# Patient Record
Sex: Male | Born: 1960
Health system: Southern US, Community
[De-identification: ages and names within clinical notes are randomized; demographics above are authoritative.]

## PROBLEM LIST (undated history)

## (undated) DIAGNOSIS — M1990 Unspecified osteoarthritis, unspecified site: Secondary | ICD-10-CM

## (undated) DIAGNOSIS — H269 Unspecified cataract: Secondary | ICD-10-CM

## (undated) HISTORY — DX: Unspecified osteoarthritis, unspecified site: M19.90

## (undated) HISTORY — DX: Unspecified cataract: H26.9

---

## 2020-01-30 ENCOUNTER — Encounter (HOSPITAL_COMMUNITY): Payer: Self-pay | Admitting: *Deleted

## 2020-01-30 ENCOUNTER — Emergency Department (HOSPITAL_COMMUNITY): Payer: Self-pay

## 2020-01-30 ENCOUNTER — Other Ambulatory Visit: Payer: Self-pay

## 2020-01-30 ENCOUNTER — Emergency Department (HOSPITAL_COMMUNITY)
Admission: EM | Admit: 2020-01-30 | Discharge: 2020-01-31 | Disposition: A | Discharge status: Home / Self Care | Payer: Self-pay | Attending: Emergency Medicine | Admitting: Emergency Medicine

## 2020-01-30 DIAGNOSIS — I10 Essential (primary) hypertension: Secondary | ICD-10-CM | POA: Insufficient documentation

## 2020-01-30 DIAGNOSIS — F17210 Nicotine dependence, cigarettes, uncomplicated: Secondary | ICD-10-CM | POA: Insufficient documentation

## 2020-01-30 DIAGNOSIS — F1099 Alcohol use, unspecified with unspecified alcohol-induced disorder: Secondary | ICD-10-CM | POA: Insufficient documentation

## 2020-01-30 DIAGNOSIS — F1090 Alcohol use, unspecified, uncomplicated: Secondary | ICD-10-CM

## 2020-01-30 DIAGNOSIS — Z789 Other specified health status: Secondary | ICD-10-CM

## 2020-01-30 LAB — BASIC METABOLIC PANEL
Anion gap: 18 — ABNORMAL HIGH (ref 5–15)
BUN: 6 mg/dL — ABNORMAL LOW (ref 8–23)
CO2: 21 mmol/L — ABNORMAL LOW (ref 22–32)
Calcium: 9.5 mg/dL (ref 8.9–10.3)
Chloride: 99 mmol/L (ref 98–111)
Creatinine, Ser: 0.83 mg/dL (ref 0.61–1.24)
GFR calc Af Amer: >60 mL/min (ref >60)
GFR calc non Af Amer: >60 mL/min (ref >60)
Glucose, Bld: 87 mg/dL (ref 70–99)
Potassium: 3.8 mmol/L (ref 3.5–5.1)
Sodium: 138 mmol/L (ref 135–145)

## 2020-01-30 LAB — CBC
HCT: 41.4 % (ref 39.0–52.0)
Hemoglobin: 14.3 g/dL (ref 13.0–17.0)
MCH: 31.6 pg (ref 26.0–34.0)
MCHC: 34.5 g/dL (ref 30.0–36.0)
MCV: 91.4 fL (ref 80.0–100.0)
Platelets: 181 10*3/uL (ref 150–400)
RBC: 4.53 MIL/uL (ref 4.22–5.81)
RDW: 14 % (ref 11.5–15.5)
WBC: 3.5 10*3/uL — ABNORMAL LOW (ref 4.0–10.5)
nRBC: 0 % (ref 0.0–0.2)

## 2020-01-30 LAB — TROPONIN I (HIGH SENSITIVITY)
Troponin I (High Sensitivity): 24 ng/L — ABNORMAL HIGH (ref <18)
Troponin I (High Sensitivity): 32 ng/L — ABNORMAL HIGH (ref <18)

## 2020-01-30 LAB — LACTIC ACID, PLASMA: Lactic Acid, Venous: 3.4 mmol/L (ref 0.5–1.9)

## 2020-01-30 MED ORDER — AMLODIPINE BESYLATE 5 MG PO TABS
5.0000 mg | ORAL_TABLET | Freq: Once | ORAL | Status: AC
  Administered 2020-01-30: 5 mg via ORAL
  Filled 2020-01-30: qty 1

## 2020-01-30 MED ORDER — LACTATED RINGERS IV BOLUS
1000.0000 mL | Freq: Once | INTRAVENOUS | Status: AC
  Administered 2020-01-30: 1000 mL via INTRAVENOUS

## 2020-01-30 MED ORDER — SODIUM CHLORIDE 0.9% FLUSH: 3.0000 mL | Freq: Once | INTRAVENOUS | Status: DC

## 2020-01-30 MED ORDER — AMLODIPINE BESYLATE 5 MG PO TABS
10.0000 mg | ORAL_TABLET | Freq: Once | ORAL | Status: AC
  Administered 2020-01-30: 10 mg via ORAL
  Filled 2020-01-30: qty 2

## 2020-01-30 NOTE — ED Triage Notes (Signed)
The pt saw a doctor yesterday and was toild that his bp was high  He was told to come to the ed yestereday  But he did not   Today his bp is high

## 2020-01-30 NOTE — ED Provider Notes (Signed)
[facility] EMERGENCY DEPARTMENT Provider Note   CSN: [id number] Arrival date & time: 01/30/20  1855     History Chief Complaint  Patient presents with  . Hypertension    Christian Matthews is a 59 y.o. male.  Patient is a 59 year old male with no known medical history but has not seen a doctor for years and for social security met with the doctor yesterday and was told his blood pressure was elevated.  Patient states that when he checked his blood pressure today at home it was elevated at 230/190.  This scared him and he decided to come to the emergency room even though yesterday he went home.  Patient denies any new vision changes, headache, chest pain, shortness of breath.  No unilateral numbness or weakness.  Patient reports that he does have a family history of people with high blood pressure but he has never checked himself until yesterday.  He does smoke cigarettes daily but denies any cocaine or marijuana use.  He also drinks alcohol daily but reports he can drink it or not drink it and does not get symptoms of withdrawal.  Usually has at least 1-2 beers per day.  He currently takes no medications.  Diet has been unchanged.  No weight loss.  The history is provided by the patient.  Hypertension Chronicity: unknown. The current episode started more than 1 week ago. The problem occurs constantly. The problem has not changed since onset.Pertinent negatives include no chest pain, no abdominal pain, no headaches and no shortness of breath. Associated symptoms comments: No focal weakness. Nothing aggravates the symptoms. Nothing relieves the symptoms. He has tried nothing for the symptoms.       History reviewed. No pertinent past medical history.  There are no problems to display for this patient.   History reviewed. No pertinent surgical history.     No family history on file.  Social History   Tobacco Use  . Smoking status: Current Every Day Smoker  . Smokeless  tobacco: Never Used  Substance Use Topics  . Alcohol use: Yes  . Drug use: Not on file    Home Medications Prior to Admission medications   Not on File    Allergies    Patient has no allergy information on record.  Review of Systems   Review of Systems  Respiratory: Negative for shortness of breath.   Cardiovascular: Negative for chest pain.  Gastrointestinal: Negative for abdominal pain.  Neurological: Negative for headaches.  All other systems reviewed and are negative.   Physical Exam Updated Vital Signs BP (!) 163/115 (BP Location: Right Arm)   Pulse (!) 123   Temp 99.1 F (37.3 C) (Oral)   Resp 16   Ht 5' 5"  (1.651 m)   Wt 56.2 kg   SpO2 100%   BMI 20.63 kg/m   Physical Exam Vitals and nursing note reviewed.  Constitutional:      General: He is not in acute distress.    Appearance: Normal appearance. He is well-developed and normal weight.  HENT:     Head: Normocephalic and atraumatic.  Eyes:     Conjunctiva/sclera: Conjunctivae normal.     Pupils: Pupils are equal, round, and reactive to light.  Cardiovascular:     Rate and Rhythm: Normal rate and regular rhythm.     Heart sounds: No murmur.  Pulmonary:     Effort: Pulmonary effort is normal. No respiratory distress.     Breath sounds: Normal breath sounds. No  wheezing or rales.  Abdominal:     General: There is no distension.     Palpations: Abdomen is soft.     Tenderness: There is no abdominal tenderness. There is no guarding or rebound.  Musculoskeletal:        General: No tenderness. Normal range of motion.     Cervical back: Normal range of motion and neck supple.     Right lower leg: No edema.     Left lower leg: No edema.  Skin:    General: Skin is warm and dry.     Capillary Refill: Capillary refill takes less than 2 seconds.     Findings: No erythema or rash.  Neurological:     General: No focal deficit present.     Mental Status: He is alert and oriented to person, place, and time.  Mental status is at baseline.  Psychiatric:        Mood and Affect: Mood normal.        Behavior: Behavior normal.        Thought Content: Thought content normal.     ED Results / Procedures / Treatments   Labs (all labs ordered are listed, but only abnormal results are displayed) Labs Reviewed  BASIC METABOLIC PANEL - Abnormal; Notable for the following components:      Result Value   CO2 21 (*)    BUN 6 (*)    Anion gap 18 (*)    All other components within normal limits  CBC - Abnormal; Notable for the following components:   WBC 3.5 (*)    All other components within normal limits  LACTIC ACID, PLASMA - Abnormal; Notable for the following components:   Lactic Acid, Venous 3.4 (*)    All other components within normal limits  TROPONIN I (HIGH SENSITIVITY) - Abnormal; Notable for the following components:   Troponin I (High Sensitivity) 24 (*)    All other components within normal limits  TROPONIN I (HIGH SENSITIVITY)    EKG EKG Interpretation  Date/Time:  Saturday January 30 2020 19:30:35 EST Ventricular Rate:  108 PR Interval:  198 QRS Duration: 72 QT Interval:  342 QTC Calculation: 458 R Axis:   37 Text Interpretation: Sinus tachycardia Biatrial enlargement Possible Anterior infarct , age undetermined Abnormal ECG No previous ECGs available Confirmed by Gerlene Fee [phone]) on 01/30/2020 7:40:12 PM   Radiology DG Chest 2 View  Result Date: 01/30/2020 CLINICAL DATA:  59 year old male with hypertension. EXAM: CHEST - 2 VIEW COMPARISON:  None. FINDINGS: The lungs are clear. There is no pleural effusion or pneumothorax. The cardiac silhouette is within normal limits. Mild atherosclerotic calcification of the aortic arch. No acute osseous pathology. IMPRESSION: No acute cardiopulmonary process. Electronically Signed   By: Anner Crete M.D.   On: 01/30/2020 19:50    Procedures Procedures (including critical care time)  Medications Ordered in ED Medications    sodium chloride flush (NS) 0.9 % injection 3 mL (has no administration in time range)  amLODipine (NORVASC) tablet 5 mg (has no administration in time range)  amLODipine (NORVASC) tablet 10 mg (has no administration in time range)    ED Course  I have reviewed the triage vital signs and the nursing notes.  Pertinent labs & imaging results that were available during my care of the patient were reviewed by me and considered in my medical decision making (see chart for details).    MDM Rules/Calculators/A&P  59 year old male presenting today with hypertension.  This is most likely a long standing issue as he has not checked his blood pressure in years or seen a doctor in years until yesterday when he was told his blood pressure was elevated.  He denies any symptoms concerning for hypertensive emergency.  BMP today does show an anion gap of unknown significance.  Patient reports that he does have 1-2 beers per day but possibly he is underestimating and could have a lactic acidosis causing the anion gap.  He does not use salicylates, low suspicion for toxic alcohol ingestion.  He has having no chest pain but does have a troponin of 24 which may be related to the high blood pressure.  EKG today does show some signs concerning for LVH.  He is mildly tachycardic in the low 100s but low suspicion for PE at this time.  Patient has no strokelike symptoms and is otherwise well-appearing.  However repeated blood pressures are elevated at 240/110.  Lactic acid is pending.  Patient given 10 mg of amlodipine.  Will cycle troponins and continue to follow.  11:22 PM Lactate is elevated at 3.4 which is most likely the cause of his anion gap.  Patient was given IV fluids.  Still waiting on delta troponin.  After amlodipine patient's blood pressure is now 187/96.  The intention was that patient would get 10 mg of amlodipine but there was a glitch in the ordering system and he received 15  mg.  Final Clinical Impression(s) / ED Diagnoses Final diagnoses:  None    Rx / DC Orders ED Discharge Orders    None       Blanchie Dessert, MD 02/01/20 2130

## 2020-01-30 NOTE — ED Notes (Signed)
Dr. Anitra Lauth notified of lactic acid of 3.4

## 2020-01-31 LAB — TROPONIN I (HIGH SENSITIVITY): Troponin I (High Sensitivity): 33 ng/L — ABNORMAL HIGH (ref <18)

## 2020-01-31 MED ORDER — ADULT MULTIVITAMIN W/MINERALS CH: 1.0000 | ORAL_TABLET | Freq: Every day | ORAL | 0 refills | Status: DC

## 2020-01-31 MED ORDER — AMLODIPINE BESYLATE 10 MG PO TABS: 10.0000 mg | ORAL_TABLET | Freq: Every day | ORAL | 0 refills | Status: DC

## 2020-01-31 MED ORDER — THIAMINE HCL 100 MG PO TABS: 100.0000 mg | ORAL_TABLET | Freq: Every day | ORAL | 0 refills | Status: DC

## 2020-01-31 NOTE — ED Notes (Signed)
RN is drawing 3rd Troponin now

## 2020-01-31 NOTE — ED Notes (Signed)
Pt ambulatory to BR with strong, steady gait

## 2020-01-31 NOTE — ED Notes (Signed)
Troponin drawn, labeled with 2 pt identifiers, and sent to lab 

## 2020-01-31 NOTE — ED Provider Notes (Signed)
11:45 PM  Assumed care from Dr. Anitra Lauth.  Patient is a 59 year old male with history of heavy alcohol use, undiagnosed hypertension who presents today with asymptomatic hypertension.  Received 15 mg of amlodipine and blood pressure is slowly improving.  Labs show metabolic acidosis with anion gap likely from heavy alcohol use with elevated lactate.  No infectious symptoms.  Getting IV fluids.  Troponin initially was 24 and then has gone up to 32.  Plan is to repeat third troponin at 12:30 AM.  Likely discharge home on 10 mg of amlodipine.  Social work consult has been placed to help patient establish care with a PCP.  3:30 AM  Pt's third troponin is flat.  He still has no complaints.  Blood pressure currently 140/70s.  No sign of alcohol withdrawal.  I provided list of primary care physicians for follow-up.  Will discharge with amlodipine, thiamine and multivitamin.  Have encouraged him to decrease his alcohol intake.  Discussed return precautions.  He is comfortable with this plan.   At this time, I do not feel there is any life-threatening condition present. I have reviewed, interpreted and discussed all results (EKG, imaging, lab, urine as appropriate) and exam findings with patient/family. I have reviewed nursing notes and appropriate previous records.  I feel the patient is safe to be discharged home without further emergent workup and can continue workup as an outpatient as needed. Discussed usual and customary return precautions. Patient/family verbalize understanding and are comfortable with this plan.  Outpatient follow-up has been provided as needed. All questions have been answered.    Karson Reede, Layla Maw, DO 01/31/20 [phone]

## 2020-01-31 NOTE — Discharge Instructions (Signed)
Steps to find a Primary Care Provider (PCP): ° °Call [phone] or [phone] to access "[facility] Find a Doctor Service." ° °2.  You may also go on the [facility] website at [email or web address] ° °3.  [facility] also frequently accepts new patients. ° °[facility]  °[address] °[place] [address] °[phone] ° °4.  There are also multiple [facility], [facility] practices throughout the [place] that are frequently accepting new patients. You may find a clinic that is close to your home and contact them. ° °[facility] °[email or web address] °[phone] ° °[facility] °[email or web address] ° °[facility] °[email or web address] °[phone] ° °[facility] °[email or web address] °[phone] ° °5.  Local Health Departments also can provide primary care services. ° °[facility]  °[address] °[place] [address] [phone] ° °[facility] °[address] °[place] [address] [phone] ° °[facility] °[address]  °[place] [address] °[phone] ° ° °

## 2020-02-01 NOTE — Progress Notes (Signed)
02/01/2020 6:54 pm TOC CM spoke to pt and gave permission to speak to sister. Provided sister with pt's information on appt at [facility] on 02/11/2020 at 8:50 am. States he will pick up his medication on tomorrow. Educated the importance of taking medication as prescribed. Referring ED provider updated. Isidoro Donning RN CCM, [facility] ED TOC CM [phone]

## 2020-02-10 NOTE — Progress Notes (Signed)
Patient ID: Christian Matthews, male   DOB: 02/14/1961, 59 y.o.   MRN: [id number]   Christian Matthews, is a 59 y.o. male  INO:[id number]  [id number]:[id number]  DOB - January 24, 1961  Subjective:  Chief Complaint and HPI: Christian Matthews is a 59 y.o. male here today to establish care and for a follow up visit After ED visit 01/30/2020 for elevated BP.  He was started on amlodipine 10mg .  He is compliant on this.  He did not know that he had htn before now.  Has never been on meds.  Has cut back smoking to 3 cigs/day.  Drinks 2-40 ounce beers daily.  Does not exercise.  No HA/CP.  His wife is here with him today.  Denies any cocaine or illicit drug use.    From A/P: 59 year old male presenting today with hypertension.  This is most likely a long standing issue as he has not checked his blood pressure in years or seen a doctor in years until yesterday when he was told his blood pressure was elevated.  He denies any symptoms concerning for hypertensive emergency.  BMP today does show an anion gap of unknown significance.  Patient reports that he does have 1-2 beers per day but possibly he is underestimating and could have a lactic acidosis causing the anion gap.  He does not use salicylates, low suspicion for toxic alcohol ingestion.  He has having no chest pain but does have a troponin of 24 which may be related to the high blood pressure.  EKG today does show some signs concerning for LVH.  He is mildly tachycardic in the low 100s but low suspicion for PE at this time.  Patient has no strokelike symptoms and is otherwise well-appearing.  However repeated blood pressures are elevated at 240/110.  Lactic acid is pending.  Patient given 10 mg of amlodipine.  Will cycle troponins and continue to follow.  11:22 PM Lactate is elevated at 3.4 which is most likely the cause of his anion gap.  Patient was given IV fluids.  Still waiting on delta troponin.  After amlodipine patient's blood pressure is now 187/96.  The intention was that  patient would get 10 mg of amlodipine but there was a glitch in the ordering system and he received 15 mg.  ED/Hospital notes reviewed/summarized above.   Social History:  Married, drinks alcohol, smokes Family history:  All of family with heart disease, htn  ROS:   Constitutional:  No f/c, No night sweats, No unexplained weight loss. EENT:  No vision changes, No blurry vision, No hearing changes. No mouth, throat, or ear problems.  Respiratory: No cough, No SOB Cardiac: No CP, no palpitations GI:  No abd pain, No N/V/D. GU: No Urinary s/sx Musculoskeletal: No joint pain Neuro: No headache, no dizziness, no motor weakness.  Skin: No rash Endocrine:  No polydipsia. No polyuria.  Psych: Denies SI/HI  No problems updated.  ALLERGIES: No Known Allergies  PAST MEDICAL HISTORY: History reviewed. No pertinent past medical history.  MEDICATIONS AT HOME: Prior to Admission medications   Medication Sig Start Date End Date Taking? Authorizing Provider  amLODipine (NORVASC) 10 MG tablet Take 1 tablet (10 mg total) by mouth daily. 02/11/20  Yes 02/13/20, PA-C  Multiple Vitamin (MULTIVITAMIN WITH MINERALS) TABS tablet Take 1 tablet by mouth daily. 01/31/20  Yes Ward, 02/02/20, DO  thiamine 100 MG tablet Take 1 tablet (100 mg total) by mouth daily. 01/31/20  Yes Ward, 02/02/20, DO  hydrochlorothiazide (  HYDRODIURIL) 25 MG tablet Take 1 tablet (25 mg total) by mouth daily. 02/11/20   Argentina Donovan, PA-C     Objective:  EXAM:   Vitals:   02/11/20 0859  BP: (!) 204/106  Pulse: (!) 103  Temp: 97.7 F (36.5 C)  TempSrc: Temporal  SpO2: 99%  Weight: 123 lb (55.8 kg)  Height: 5\' 5"  (1.651 m)    General appearance : A&OX3. NAD. Non-toxic-appearing HEENT: Atraumatic and Normocephalic.  PERRLA. EOM intact.   Neck: supple, no JVD. No cervical lymphadenopathy. No thyromegaly Chest/Lungs:  Breathing-non-labored, Good air entry bilaterally, breath sounds normal without rales,  rhonchi, or wheezing  CVS: S1 S2 regular, no murmurs, gallops, rubs  Extremities: Bilateral Lower Ext shows no edema, both legs are warm to touch with = pulse throughout Neurology:  CN II-XII grossly intact, Non focal.   Psych:  TP linear. J/I WNL. Normal speech. Appropriate eye contact and affect.  Skin:  No Rash  Data Review No results found for: HGBA1C   Assessment & Plan   1. Hypertensive urgency- -Check blood pressure daily and record and bring to next visit - cloNIDine (CATAPRES) tablet 0.2 mg.  BP responded down to 181/94. - Lipid panel - Comprehensive metabolic panel  2. Smoker He is down to 3 cigs per day. Smoking and dangers of nicotine have been discussed at length. Long term health consequences of smoking reviewed in detail.  Methods for helping with cessation have been reviewed.  Patient expresses understanding.   3. Alcohol use Cessation advised FactoringRate.ca is the website for alcoholics anonymous.  It can be a good source of education and support for stopping alcohol intake.  There are many online and in-person meetings available in [place] metabolic panel  4. Screening cholesterol level - Lipid panel  5. Encounter for examination following treatment at hospital  6. Hypertension, unspecified type Not controlled-add HCTZ - hydrochlorothiazide (HYDRODIURIL) 25 MG tablet; Take 1 tablet (25 mg total) by mouth daily.  Dispense: 30 tablet; Refill: 3 - amLODipine (NORVASC) 10 MG tablet; Take 1 tablet (10 mg total) by mouth daily.  Dispense: 30 tablet; Refill: 3 - Lipid panel - Comprehensive metabolic panel Drink 80 ounces water daily(high lactic acid in ED) -Check blood pressure daily and record and bring to next visit  Counseled extensively with the patient and his wife about all of the above  Patient have been counseled extensively about nutrition and exercise  Return in about 3 weeks (around 03/03/2020) for 3 weeks with Lurena Joiner for BP management and  education and 6 weeks be assigned a PCP here.  The patient was given clear instructions to go to ER or return to medical center if symptoms don't improve, worsen or new problems develop. The patient verbalized understanding. The patient was told to call to get lab results if they haven't heard anything in the next week.     Freeman Caldron, PA-C [facility], [place]   02/11/2020, 9:23 AM

## 2020-02-11 ENCOUNTER — Other Ambulatory Visit: Payer: Self-pay

## 2020-02-11 ENCOUNTER — Ambulatory Visit: Payer: Self-pay | Attending: Family Medicine | Admitting: Physician Assistant

## 2020-02-11 VITALS — BP 181/94 | HR 103 | Temp 97.7°F | Ht 65.0 in | Wt 123.0 lb

## 2020-02-11 DIAGNOSIS — I160 Hypertensive urgency: Secondary | ICD-10-CM

## 2020-02-11 DIAGNOSIS — Z09 Encounter for follow-up examination after completed treatment for conditions other than malignant neoplasm: Secondary | ICD-10-CM

## 2020-02-11 DIAGNOSIS — Z13220 Encounter for screening for lipoid disorders: Secondary | ICD-10-CM

## 2020-02-11 DIAGNOSIS — Z7289 Other problems related to lifestyle: Secondary | ICD-10-CM

## 2020-02-11 DIAGNOSIS — F17200 Nicotine dependence, unspecified, uncomplicated: Secondary | ICD-10-CM

## 2020-02-11 DIAGNOSIS — Z789 Other specified health status: Secondary | ICD-10-CM

## 2020-02-11 DIAGNOSIS — I10 Essential (primary) hypertension: Secondary | ICD-10-CM

## 2020-02-11 MED ORDER — CLONIDINE HCL 0.2 MG PO TABS
0.2000 mg | ORAL_TABLET | Freq: Once | ORAL | Status: AC
  Administered 2020-02-11: 09:00:00 0.2 mg via ORAL

## 2020-02-11 MED ORDER — HYDROCHLOROTHIAZIDE 25 MG PO TABS: 25.0000 mg | ORAL_TABLET | Freq: Every day | ORAL | 3 refills | Status: DC

## 2020-02-11 MED ORDER — AMLODIPINE BESYLATE 10 MG PO TABS: 10.0000 mg | ORAL_TABLET | Freq: Every day | ORAL | 3 refills | Status: DC

## 2020-02-11 MED FILL — HYDROCHLOROTHIAZIDE 25 MG T: 25 | 30 days supply | Qty: 30 | Fill #0

## 2020-02-11 MED FILL — AMLODIPINE BESYLATE 10 MG T: 10 | 30 days supply | Qty: 30 | Fill #0

## 2020-02-11 NOTE — Patient Instructions (Addendum)
Drink 80 ounces water daily Check blood pressure daily and record and bring to next visit  NoInsuranceAgent.es is the website for alcoholics anonymous.  It can be a good source of education and support for stopping alcohol intake.  There are many online and in-person meetings available in [place]  Hypertension, Adult Hypertension is another name for high blood pressure. High blood pressure forces your heart to work harder to pump blood. This can cause problems over time. There are two numbers in a blood pressure reading. There is a top number (systolic) over a bottom number (diastolic). It is best to have a blood pressure that is below 120/80. Healthy choices can help lower your blood pressure, or you may need medicine to help lower it. What are the causes? The cause of this condition is not known. Some conditions may be related to high blood pressure. What increases the risk?  Smoking.  Having type 2 diabetes mellitus, high cholesterol, or both.  Not getting enough exercise or physical activity.  Being overweight.  Having too much fat, sugar, calories, or salt (sodium) in your diet.  Drinking too much alcohol.  Having long-term (chronic) kidney disease.  Having a family history of high blood pressure.  Age. Risk increases with age.  Race. You may be at higher risk if you are African American.  Gender. Men are at higher risk than women before age 90. After age 21, women are at higher risk than men.  Having obstructive sleep apnea.  Stress. What are the signs or symptoms?  High blood pressure may not cause symptoms. Very high blood pressure (hypertensive crisis) may cause: ? Headache. ? Feelings of worry or nervousness (anxiety). ? Shortness of breath. ? Nosebleed. ? A feeling of being sick to your stomach (nausea). ? Throwing up (vomiting). ? Changes in how you see. ? Very bad chest pain. ? Seizures. How is this treated?  This condition is treated by making healthy  lifestyle changes, such as: ? Eating healthy foods. ? Exercising more. ? Drinking less alcohol.  Your health care provider may prescribe medicine if lifestyle changes are not enough to get your blood pressure under control, and if: ? Your top number is above 130. ? Your bottom number is above 80.  Your personal target blood pressure may vary. Follow these instructions at home: Eating and drinking   If told, follow the DASH eating plan. To follow this plan: ? Fill one half of your plate at each meal with fruits and vegetables. ? Fill one fourth of your plate at each meal with whole grains. Whole grains include whole-wheat pasta, brown rice, and whole-grain bread. ? Eat or drink low-fat dairy products, such as skim milk or low-fat yogurt. ? Fill one fourth of your plate at each meal with low-fat (lean) proteins. Low-fat proteins include fish, chicken without skin, eggs, beans, and tofu. ? Avoid fatty meat, cured and processed meat, or chicken with skin. ? Avoid pre-made or processed food.  Eat less than 1,500 mg of salt each day.  Do not drink alcohol if: ? Your doctor tells you not to drink. ? You are pregnant, may be pregnant, or are planning to become pregnant.  If you drink alcohol: ? Limit how much you use to:  0-1 drink a day for women.  0-2 drinks a day for men. ? Be aware of how much alcohol is in your drink. In the [place] one drink equals one 12 oz bottle of beer (355 mL), one 5 oz  glass of wine (148 mL), or one 1 oz glass of hard liquor (44 mL). Lifestyle   Work with your doctor to stay at a healthy weight or to lose weight. Ask your doctor what the best weight is for you.  Get at least 30 minutes of exercise most days of the week. This may include walking, swimming, or biking.  Get at least 30 minutes of exercise that strengthens your muscles (resistance exercise) at least 3 days a week. This may include lifting weights or doing Pilates.  Do not use any products  that contain nicotine or tobacco, such as cigarettes, e-cigarettes, and chewing tobacco. If you need help quitting, ask your doctor.  Check your blood pressure at home as told by your doctor.  Keep all follow-up visits as told by your doctor. This is important. Medicines  Take over-the-counter and prescription medicines only as told by your doctor. Follow directions carefully.  Do not skip doses of blood pressure medicine. The medicine does not work as well if you skip doses. Skipping doses also puts you at risk for problems.  Ask your doctor about side effects or reactions to medicines that you should watch for. Contact a doctor if you:  Think you are having a reaction to the medicine you are taking.  Have headaches that keep coming back (recurring).  Feel dizzy.  Have swelling in your ankles.  Have trouble with your vision. Get help right away if you:  Get a very bad headache.  Start to feel mixed up (confused).  Feel weak or numb.  Feel faint.  Have very bad pain in your: ? Chest. ? Belly (abdomen).  Throw up more than once.  Have trouble breathing. Summary  Hypertension is another name for high blood pressure.  High blood pressure forces your heart to work harder to pump blood.  For most people, a normal blood pressure is less than 120/80.  Making healthy choices can help lower blood pressure. If your blood pressure does not get lower with healthy choices, you may need to take medicine. This information is not intended to replace advice given to you by your health care provider. Make sure you discuss any questions you have with your health care provider. Document Revised: 07/16/2018 Document Reviewed: 07/16/2018 Elsevier Patient Education  2020 [employer].

## 2020-02-12 LAB — COMPREHENSIVE METABOLIC PANEL
ALT: 49 IU/L — ABNORMAL HIGH (ref 0–44)
AST: 67 IU/L — ABNORMAL HIGH (ref 0–40)
Albumin/Globulin Ratio: 1.3 (ref 1.2–2.2)
Albumin: 4.9 g/dL — ABNORMAL HIGH (ref 3.8–4.8)
Alkaline Phosphatase: 110 IU/L (ref 39–117)
BUN/Creatinine Ratio: 12 (ref 10–24)
BUN: 11 mg/dL (ref 8–27)
Bilirubin Total: 0.4 mg/dL (ref 0.0–1.2)
CO2: 23 mmol/L (ref 20–29)
Calcium: 10.3 mg/dL — ABNORMAL HIGH (ref 8.6–10.2)
Chloride: 97 mmol/L (ref 96–106)
Creatinine, Ser: 0.91 mg/dL (ref 0.76–1.27)
GFR calc Af Amer: 105 mL/min/{1.73_m2} (ref >59)
GFR calc non Af Amer: 91 mL/min/{1.73_m2} (ref >59)
Globulin, Total: 3.8 g/dL (ref 1.5–4.5)
Glucose: 107 mg/dL — ABNORMAL HIGH (ref 65–99)
Potassium: 4.2 mmol/L (ref 3.5–5.2)
Sodium: 138 mmol/L (ref 134–144)
Total Protein: 8.7 g/dL — ABNORMAL HIGH (ref 6.0–8.5)

## 2020-02-12 LAB — LIPID PANEL
Chol/HDL Ratio: 1.9 ratio (ref 0.0–5.0)
Cholesterol, Total: 182 mg/dL (ref 100–199)
HDL: 98 mg/dL (ref >39)
LDL Chol Calc (NIH): 71 mg/dL (ref 0–99)
Triglycerides: 71 mg/dL (ref 0–149)
VLDL Cholesterol Cal: 13 mg/dL (ref 5–40)

## 2020-03-03 ENCOUNTER — Ambulatory Visit: Payer: Self-pay | Attending: Family Medicine | Admitting: Pharmacist

## 2020-03-03 ENCOUNTER — Encounter: Payer: Self-pay | Admitting: Pharmacist

## 2020-03-03 ENCOUNTER — Other Ambulatory Visit: Payer: Self-pay

## 2020-03-03 VITALS — BP 171/90 | HR 101

## 2020-03-03 DIAGNOSIS — I10 Essential (primary) hypertension: Secondary | ICD-10-CM

## 2020-03-03 NOTE — Progress Notes (Signed)
   S:    PCP: Not assigned   Patient arrives in good spirits. Presents to the clinic for BP check. Patient was referred on 02/11/2020 by Georgian Co.   Patient reports adherence with medications but did not take amlodipine yesterday and did not take any medication this morning. He usually takes HCTZ in the morning and amlodipine in the evening.   Current BP Medications include:  Amlodipine 10 mg daily, HCTZ 25 mg daily  Dietary habits include:  Reports compliance with salt restriction and denies drinking excess caffeine  Exercise habits include: none  Family / Social history:  - FHx: no pertinent positives listed in Epic  - Tobacco: smokes "every now and then" - has not smoked today - Alcohol: drank 1 beer last night   O:  Vitals:   03/03/20 1127  BP: (!) 171/90  Pulse: (!) 101    Home BP readings: reports that he takes at home but does not recall the readings  Last 3 Office BP readings: BP Readings from Last 3 Encounters:  03/03/20 (!) 171/90  02/11/20 (!) 181/94  01/31/20 (!) 148/79   BMET    Component Value Date/Time   NA 138 02/11/2020 0956   K 4.2 02/11/2020 0956   CL 97 02/11/2020 0956   CO2 23 02/11/2020 0956   GLUCOSE 107 (H) 02/11/2020 0956   GLUCOSE 87 01/30/2020 1934   BUN 11 02/11/2020 0956   CREATININE 0.91 02/11/2020 0956   CALCIUM 10.3 (H) 02/11/2020 0956   GFRNONAA 91 02/11/2020 0956   GFRAA 105 02/11/2020 0956    Renal function: CrCl cannot be calculated (Patient's most recent lab result is older than the maximum 21 days allowed.).  Clinical ASCVD: No  The 10-year ASCVD risk score Denman George DC Jr., et al., 2013) is: 30.1%   Values used to calculate the score:     Age: 59 years     Sex: Male     Is Non-Hispanic African American: Yes     Diabetic: No     Tobacco smoker: Yes     Systolic Blood Pressure: 171 mmHg     Is BP treated: Yes     HDL Cholesterol: 98 mg/dL     Total Cholesterol: 182 mg/dL   A/P: Hypertension longstanding currently  uncontrolled on current medications. BP Goal = <130/80 mmHg. Patient reports adherence with medications but he has not taken any medication in the past ~24 hours. I instructed him to take his HCTZ tab while here today.  Will have him take both medications in the morning starting tomorrow in efforts to improve adherence. I have instructed him to take both of these before seeing me in one week for BP recheck.  -Continued HCTZ, amlodipine at current doses.  -Counseled on lifestyle modifications for blood pressure control including reduced dietary sodium, increased exercise, adequate sleep  Results reviewed and written information provided.   Total time in face-to-face counseling 15 minutes.   F/U Clinic Visit in 1 week.   Butch Penny, PharmD, CPP Clinical Pharmacist [facility] & [facility] [phone]

## 2020-03-11 ENCOUNTER — Ambulatory Visit: Payer: Self-pay | Attending: Family Medicine | Admitting: Pharmacist

## 2020-03-11 ENCOUNTER — Other Ambulatory Visit: Payer: Self-pay

## 2020-03-11 VITALS — BP 186/93 | HR 98

## 2020-03-11 DIAGNOSIS — I10 Essential (primary) hypertension: Secondary | ICD-10-CM

## 2020-03-11 MED ORDER — LOSARTAN POTASSIUM 50 MG PO TABS: 50.0000 mg | ORAL_TABLET | Freq: Every day | ORAL | 0 refills | Status: DC

## 2020-03-11 MED FILL — LOSARTAN POTASSIUM 50 MG TA: 50 | 30 days supply | Qty: 30 | Fill #0

## 2020-03-11 NOTE — Progress Notes (Signed)
   S:    PCP: Not assigned   Patient arrives in good spirits. Presents to the clinic for BP check. Patient was referred on 02/11/2020 by Georgian Co. I saw him on 03/03/2020 - BP was still elevated but he had not taken his medications that day.    Today, pt denies chest pain, dyspnea, HA or blurred vision. No dizziness or LE edema.   Patient reports adherence with medications and took both this morning.   Current BP Medications include:  Amlodipine 10 mg daily, HCTZ 25 mg daily  Dietary habits include:  Reports compliance with salt restriction and denies drinking excess caffeine  Exercise habits include: none  Family / Social history:  - FHx: no pertinent positives listed in Epic  - Tobacco: smokes "every now and then" - has not smoked today - Alcohol: drank 1 beer last night   O:  Vitals:   03/11/20 1341  BP: (!) 186/93  Pulse: 98    Home BP readings: reports that he takes at home but does not recall the readings  Last 3 Office BP readings: BP Readings from Last 3 Encounters:  03/11/20 (!) 186/93  03/03/20 (!) 171/90  02/11/20 (!) 181/94   BMET    Component Value Date/Time   NA 138 02/11/2020 0956   K 4.2 02/11/2020 0956   CL 97 02/11/2020 0956   CO2 23 02/11/2020 0956   GLUCOSE 107 (H) 02/11/2020 0956   GLUCOSE 87 01/30/2020 1934   BUN 11 02/11/2020 0956   CREATININE 0.91 02/11/2020 0956   CALCIUM 10.3 (H) 02/11/2020 0956   GFRNONAA 91 02/11/2020 0956   GFRAA 105 02/11/2020 0956    Renal function: CrCl cannot be calculated (Patient's most recent lab result is older than the maximum 21 days allowed.).  Clinical ASCVD: No  The 10-year ASCVD risk score Denman George DC Jr., et al., 2013) is: 34.3%   Values used to calculate the score:     Age: 59 years     Sex: Male     Is Non-Hispanic African American: Yes     Diabetic: No     Tobacco smoker: Yes     Systolic Blood Pressure: 186 mmHg     Is BP treated: Yes     HDL Cholesterol: 98 mg/dL     Total Cholesterol:  182 mg/dL   A/P: Hypertension longstanding currently very elevated on current medications. BP Goal = <130/80 mmHg. Patient reports adherence with medications. Denies symptoms suggestive of target organ damage. Will add losartan 50 mg daily to his regimen.  -Start losartan 50 mg daily.  -Continued HCTZ, amlodipine at current doses.  -Counseled on lifestyle modifications for blood pressure control including reduced dietary sodium, increased exercise, adequate sleep  Results reviewed and written information provided.   Total time in face-to-face counseling 15 minutes.   F/U Clinic Visit in 1 week.   Butch Penny, PharmD, CPP Clinical Pharmacist [facility] & [facility] [phone]

## 2020-03-29 ENCOUNTER — Other Ambulatory Visit: Payer: Self-pay

## 2020-03-29 ENCOUNTER — Ambulatory Visit: Payer: Self-pay | Attending: Family Medicine | Admitting: Family Medicine

## 2020-03-29 ENCOUNTER — Encounter: Payer: Self-pay | Admitting: Family Medicine

## 2020-03-29 VITALS — BP 182/79 | HR 94 | Ht 65.0 in | Wt 117.0 lb

## 2020-03-29 DIAGNOSIS — I10 Essential (primary) hypertension: Secondary | ICD-10-CM

## 2020-03-29 DIAGNOSIS — Z720 Tobacco use: Secondary | ICD-10-CM

## 2020-03-29 MED ORDER — LOSARTAN POTASSIUM 100 MG PO TABS: 100.0000 mg | ORAL_TABLET | Freq: Every day | ORAL | 1 refills | Status: DC

## 2020-03-29 NOTE — Progress Notes (Signed)
Subjective:  Patient ID: Christian Matthews, male    DOB: 1961-11-07  Age: 59 y.o. MRN: [id number]  CC: Hypertension   HPI Lochlin Matthews is a 59 year old male with a history of Hypertension here to establish care. His blood pressure is elevated and he endorses compliance with his medications. He has no dyspnea or chest pains. He has no additional concerns today. He smokes 2 cig/day.  Past Medical History:  Diagnosis Date  . Essential hypertension 03/30/2020    No past surgical history on file.  No family history on file.  No Known Allergies  Outpatient Medications Prior to Visit  Medication Sig Dispense Refill  . amLODipine (NORVASC) 10 MG tablet Take 1 tablet (10 mg total) by mouth daily. 30 tablet 3  . hydrochlorothiazide (HYDRODIURIL) 25 MG tablet Take 1 tablet (25 mg total) by mouth daily. 30 tablet 3  . Multiple Vitamin (MULTIVITAMIN WITH MINERALS) TABS tablet Take 1 tablet by mouth daily. 30 tablet 0  . thiamine 100 MG tablet Take 1 tablet (100 mg total) by mouth daily. 30 tablet 0  . losartan (COZAAR) 50 MG tablet Take 1 tablet (50 mg total) by mouth daily. 90 tablet 0   No facility-administered medications prior to visit.     ROS Review of Systems  Constitutional: Negative for activity change and appetite change.  HENT: Negative for sinus pressure and sore throat.   Eyes: Negative for visual disturbance.  Respiratory: Negative for cough, chest tightness and shortness of breath.   Cardiovascular: Negative for chest pain and leg swelling.  Gastrointestinal: Negative for abdominal distention, abdominal pain, constipation and diarrhea.  Endocrine: Negative.   Genitourinary: Negative for dysuria.  Musculoskeletal: Negative for joint swelling and myalgias.  Skin: Negative for rash.  Allergic/Immunologic: Negative.   Neurological: Negative for weakness, light-headedness and numbness.  Psychiatric/Behavioral: Negative for dysphoric mood and suicidal ideas.    Objective:   BP (!) 182/79   Pulse 94   Ht 5\' 5"  (1.651 m)   Wt 117 lb (53.1 kg)   SpO2 100%   BMI 19.47 kg/m   BP/Weight 03/29/2020 03/11/2020 0/86/5784  Systolic BP 696 295 284  Diastolic BP 79 93 90  Wt. (Lbs) 117 - -  BMI 19.47 - -      Physical Exam Constitutional:      Appearance: He is well-developed.  Neck:     Vascular: No JVD.  Cardiovascular:     Rate and Rhythm: Normal rate.     Heart sounds: Normal heart sounds. No murmur.  Pulmonary:     Effort: Pulmonary effort is normal.     Breath sounds: Normal breath sounds. No wheezing or rales.  Chest:     Chest wall: No tenderness.  Abdominal:     General: Bowel sounds are normal. There is no distension.     Palpations: Abdomen is soft. There is no mass.     Tenderness: There is no abdominal tenderness.  Musculoskeletal:        General: Normal range of motion.     Right lower leg: No edema.     Left lower leg: No edema.  Neurological:     Mental Status: He is alert and oriented to person, place, and time.  Psychiatric:        Mood and Affect: Mood normal.     CMP Latest Ref Rng & Units 02/11/2020 01/30/2020  Glucose 65 - 99 mg/dL 107(H) 87  BUN 8 - 27 mg/dL 11 6(L)  Creatinine  0.76 - 1.27 mg/dL 0.48 8.89  Sodium 169 - 144 mmol/L 138 138  Potassium 3.5 - 5.2 mmol/L 4.2 3.8  Chloride 96 - 106 mmol/L 97 99  CO2 20 - 29 mmol/L 23 21(L)  Calcium 8.6 - 10.2 mg/dL 10.3(H) 9.5  Total Protein 6.0 - 8.5 g/dL 4.5(W) -  Total Bilirubin 0.0 - 1.2 mg/dL 0.4 -  Alkaline Phos 39 - 117 IU/L 110 -  AST 0 - 40 IU/L 67(H) -  ALT 0 - 44 IU/L 49(H) -    Lipid Panel     Component Value Date/Time   CHOL 182 02/11/2020 0956   TRIG 71 02/11/2020 0956   HDL 98 02/11/2020 0956   CHOLHDL 1.9 02/11/2020 0956   LDLCALC 71 02/11/2020 0956    CBC    Component Value Date/Time   WBC 3.5 (L) 01/30/2020 1934   RBC 4.53 01/30/2020 1934   HGB 14.3 01/30/2020 1934   HCT 41.4 01/30/2020 1934   PLT 181 01/30/2020 1934   MCV 91.4 01/30/2020  1934   MCH 31.6 01/30/2020 1934   MCHC 34.5 01/30/2020 1934   RDW 14.0 01/30/2020 1934    No results found for: HGBA1C  Assessment & Plan:  1. Essential hypertension Uncontrolled Increased Losartan dose Continue Amlodipine and HCTZ Counseled on blood pressure goal of less than 130/80, low-sodium, DASH diet, medication compliance, 150 minutes of moderate intensity exercise per week. Discussed medication compliance, adverse effects. - losartan (COZAAR) 100 MG tablet; Take 1 tablet (100 mg total) by mouth daily.  Dispense: 90 tablet; Refill: 1   2. Tobacco abuse Spent 3 minutes counselling on smoking cessation and he is not ready to quit.  Meds ordered this encounter  Medications  . losartan (COZAAR) 100 MG tablet    Sig: Take 1 tablet (100 mg total) by mouth daily.    Dispense:  90 tablet    Refill:  1    Dose increase    Follow-up: Return in about 1 month (around 04/29/2020) for follow up on Hypertension.       Hoy Register, MD, FAAFP. [facility] and [facility] [place], [place] [phone]   03/29/2020, 11:31 AM

## 2020-03-29 NOTE — Patient Instructions (Signed)

## 2020-03-30 ENCOUNTER — Encounter: Payer: Self-pay | Admitting: Family Medicine

## 2020-03-30 DIAGNOSIS — I10 Essential (primary) hypertension: Secondary | ICD-10-CM

## 2020-03-30 DIAGNOSIS — F17210 Nicotine dependence, cigarettes, uncomplicated: Secondary | ICD-10-CM | POA: Insufficient documentation

## 2020-03-30 HISTORY — DX: Essential (primary) hypertension: I10

## 2020-04-13 MED FILL — HYDROCHLOROTHIAZIDE 25 MG T: 25 | 30 days supply | Qty: 30 | Fill #1

## 2020-04-13 MED FILL — AMLODIPINE BESYLATE 10 MG T: 10 | 30 days supply | Qty: 30 | Fill #1

## 2020-05-03 ENCOUNTER — Ambulatory Visit: Payer: Self-pay | Attending: Family Medicine | Admitting: Family Medicine

## 2020-05-03 ENCOUNTER — Other Ambulatory Visit: Payer: Self-pay

## 2020-05-03 ENCOUNTER — Encounter: Payer: Self-pay | Admitting: Family Medicine

## 2020-05-03 VITALS — BP 175/83 | HR 99 | Ht 65.0 in | Wt 117.4 lb

## 2020-05-03 DIAGNOSIS — I10 Essential (primary) hypertension: Secondary | ICD-10-CM

## 2020-05-03 DIAGNOSIS — F17200 Nicotine dependence, unspecified, uncomplicated: Secondary | ICD-10-CM

## 2020-05-03 MED ORDER — HYDROCHLOROTHIAZIDE 25 MG PO TABS: 25.0000 mg | ORAL_TABLET | Freq: Every day | ORAL | 3 refills | Status: DC

## 2020-05-03 MED ORDER — CARVEDILOL 6.25 MG PO TABS: 6.2500 mg | ORAL_TABLET | Freq: Two times a day (BID) | ORAL | 3 refills | Status: DC

## 2020-05-03 MED ORDER — LOSARTAN POTASSIUM 100 MG PO TABS: 100.0000 mg | ORAL_TABLET | Freq: Every day | ORAL | 3 refills | Status: DC

## 2020-05-03 MED ORDER — AMLODIPINE BESYLATE 10 MG PO TABS: 10.0000 mg | ORAL_TABLET | Freq: Every day | ORAL | 3 refills | Status: DC

## 2020-05-03 MED FILL — LOSARTAN POTASSIUM 100 MG T: 100 | 30 days supply | Qty: 30 | Fill #0

## 2020-05-03 MED FILL — CARVEDILOL 6.25 MG TABLET: 6.25 | 30 days supply | Qty: 60 | Fill #0

## 2020-05-03 NOTE — Progress Notes (Signed)
Subjective:  Patient ID: Christian Matthews, male    DOB: 08-23-1961  Age: 59 y.o. MRN: [id number]  CC: Hypertension   HPI Christian Matthews  is a 59 year old male with a history of Hypertension here for follow-up visit.  He endorses compliance with amlodipine and losartan but his blood pressure is still elevated today.  He smokes 2 cig/day and is not ready to quit at this time Denies dyspnea, chest pains or other symptoms.  Past Medical History:  Diagnosis Date  . Essential hypertension 03/30/2020    No past surgical history on file.  No family history on file.  No Known Allergies  Outpatient Medications Prior to Visit  Medication Sig Dispense Refill  . amLODipine (NORVASC) 10 MG tablet Take 1 tablet (10 mg total) by mouth daily. 30 tablet 3  . hydrochlorothiazide (HYDRODIURIL) 25 MG tablet Take 1 tablet (25 mg total) by mouth daily. 30 tablet 3  . losartan (COZAAR) 100 MG tablet Take 1 tablet (100 mg total) by mouth daily. 90 tablet 1  . Multiple Vitamin (MULTIVITAMIN WITH MINERALS) TABS tablet Take 1 tablet by mouth daily. 30 tablet 0  . thiamine 100 MG tablet Take 1 tablet (100 mg total) by mouth daily. 30 tablet 0   No facility-administered medications prior to visit.     ROS Review of Systems  Constitutional: Negative for activity change and appetite change.  HENT: Negative for sinus pressure and sore throat.   Eyes: Negative for visual disturbance.  Respiratory: Negative for cough, chest tightness and shortness of breath.   Cardiovascular: Negative for chest pain and leg swelling.  Gastrointestinal: Negative for abdominal distention, abdominal pain, constipation and diarrhea.  Endocrine: Negative.   Genitourinary: Negative for dysuria.  Musculoskeletal: Negative for joint swelling and myalgias.  Skin: Negative for rash.  Allergic/Immunologic: Negative.   Neurological: Negative for weakness, light-headedness and numbness.  Psychiatric/Behavioral: Negative for dysphoric mood  and suicidal ideas.    Objective:  BP (!) 175/83   Pulse 99   Ht 5\' 5"  (1.651 m)   Wt 117 lb 6.4 oz (53.3 kg)   SpO2 100%   BMI 19.54 kg/m   BP/Weight 05/03/2020 03/29/2020 03/11/2020  Systolic BP 175 182 186  Diastolic BP 83 79 93  Wt. (Lbs) 117.4 117 -  BMI 19.54 19.47 -      Physical Exam Constitutional:      Appearance: He is well-developed.  Neck:     Vascular: No JVD.  Cardiovascular:     Rate and Rhythm: Normal rate.     Heart sounds: Normal heart sounds. No murmur heard.   Pulmonary:     Effort: Pulmonary effort is normal.     Breath sounds: Normal breath sounds. No wheezing or rales.  Chest:     Chest wall: No tenderness.  Abdominal:     General: Bowel sounds are normal. There is no distension.     Palpations: Abdomen is soft. There is no mass.     Tenderness: There is no abdominal tenderness.  Musculoskeletal:        General: Normal range of motion.     Right lower leg: No edema.     Left lower leg: No edema.  Neurological:     Mental Status: He is alert and oriented to person, place, and time.  Psychiatric:        Mood and Affect: Mood normal.     CMP Latest Ref Rng & Units 02/11/2020 01/30/2020  Glucose 65 - 99  mg/dL 107(H) 87  BUN 8 - 27 mg/dL 11 6(L)  Creatinine 0.76 - 1.27 mg/dL 0.91 0.83  Sodium 134 - 144 mmol/L 138 138  Potassium 3.5 - 5.2 mmol/L 4.2 3.8  Chloride 96 - 106 mmol/L 97 99  CO2 20 - 29 mmol/L 23 21(L)  Calcium 8.6 - 10.2 mg/dL 10.3(H) 9.5  Total Protein 6.0 - 8.5 g/dL 8.7(H) -  Total Bilirubin 0.0 - 1.2 mg/dL 0.4 -  Alkaline Phos 39 - 117 IU/L 110 -  AST 0 - 40 IU/L 67(H) -  ALT 0 - 44 IU/L 49(H) -    Lipid Panel     Component Value Date/Time   CHOL 182 02/11/2020 0956   TRIG 71 02/11/2020 0956   HDL 98 02/11/2020 0956   CHOLHDL 1.9 02/11/2020 0956   LDLCALC 71 02/11/2020 0956    CBC    Component Value Date/Time   WBC 3.5 (L) 01/30/2020 1934   RBC 4.53 01/30/2020 1934   HGB 14.3 01/30/2020 1934   HCT 41.4  01/30/2020 1934   PLT 181 01/30/2020 1934   MCV 91.4 01/30/2020 1934   MCH 31.6 01/30/2020 1934   MCHC 34.5 01/30/2020 1934   RDW 14.0 01/30/2020 1934      Assessment & Plan:  1. Essential hypertension Uncontrolled Carvedilol added to regimen Counseled on blood pressure goal of less than 130/80, low-sodium, DASH diet, medication compliance, 150 minutes of moderate intensity exercise per week. Discussed medication compliance, adverse effects. - carvedilol (COREG) 6.25 MG tablet; Take 1 tablet (6.25 mg total) by mouth 2 (two) times daily with a meal.  Dispense: 60 tablet; Refill: 3 - amLODipine (NORVASC) 10 MG tablet; Take 1 tablet (10 mg total) by mouth daily.  Dispense: 30 tablet; Refill: 3 - hydrochlorothiazide (HYDRODIURIL) 25 MG tablet; Take 1 tablet (25 mg total) by mouth daily.  Dispense: 30 tablet; Refill: 3 - losartan (COZAAR) 100 MG tablet; Take 1 tablet (100 mg total) by mouth daily.  Dispense: 30 tablet; Refill: 3 - Basic Metabolic Panel  2. Smoker Spent 3 minutes counseling on cessation and he is not ready to quit at this time  Return in about 6 weeks (around 06/14/2020) for Hypertension.      Charlott Rakes, MD, FAAFP. [facility] and [facility], [place]   05/03/2020, 2:50 PM

## 2020-05-03 NOTE — Patient Instructions (Signed)

## 2020-05-04 ENCOUNTER — Telehealth: Payer: Self-pay

## 2020-05-04 LAB — BASIC METABOLIC PANEL
BUN/Creatinine Ratio: 14 (ref 10–24)
BUN: 12 mg/dL (ref 8–27)
CO2: 26 mmol/L (ref 20–29)
Calcium: 10.3 mg/dL — ABNORMAL HIGH (ref 8.6–10.2)
Chloride: 94 mmol/L — ABNORMAL LOW (ref 96–106)
Creatinine, Ser: 0.85 mg/dL (ref 0.76–1.27)
GFR calc Af Amer: 109 mL/min/{1.73_m2} (ref >59)
GFR calc non Af Amer: 94 mL/min/{1.73_m2} (ref >59)
Glucose: 101 mg/dL — ABNORMAL HIGH (ref 65–99)
Potassium: 3.7 mmol/L (ref 3.5–5.2)
Sodium: 136 mmol/L (ref 134–144)

## 2020-05-04 NOTE — Telephone Encounter (Signed)
Patient was called and a voicemail was left informing patient to return phone call for lab results.  A letter with normal results have been mailed out to patient.

## 2020-05-04 NOTE — Telephone Encounter (Signed)
-----   Message from Hoy Register, MD sent at 05/04/2020 12:26 PM EDT ----- Please inform the patient that labs are normal. Thank you.

## 2020-05-11 MED FILL — AMLODIPINE BESYLATE 10 MG T: 10 | 30 days supply | Qty: 30 | Fill #2

## 2020-05-11 MED FILL — HYDROCHLOROTHIAZIDE 25 MG T: 25 | 30 days supply | Qty: 30 | Fill #2

## 2020-05-17 MED FILL — CARVEDILOL 6.25 MG TABLET: 6.25 | 30 days supply | Qty: 60 | Fill #0

## 2020-05-17 MED FILL — LOSARTAN POTASSIUM 100 MG T: 100 | 30 days supply | Qty: 30 | Fill #0

## 2020-06-14 ENCOUNTER — Ambulatory Visit: Payer: Self-pay | Admitting: Family Medicine

## 2020-07-13 MED FILL — HYDROCHLOROTHIAZIDE 25 MG T: 25 | 30 days supply | Qty: 30 | Fill #3

## 2020-07-13 MED FILL — LOSARTAN POTASSIUM 100 MG T: 100 | 30 days supply | Qty: 30 | Fill #1

## 2020-08-01 ENCOUNTER — Ambulatory Visit: Payer: Self-pay | Admitting: Family Medicine

## 2020-08-08 ENCOUNTER — Other Ambulatory Visit: Payer: Self-pay

## 2020-08-08 ENCOUNTER — Ambulatory Visit: Payer: Self-pay | Attending: Family Medicine | Admitting: Family Medicine

## 2020-08-08 ENCOUNTER — Encounter: Payer: Self-pay | Admitting: Family Medicine

## 2020-08-08 ENCOUNTER — Other Ambulatory Visit: Payer: Self-pay | Admitting: Family Medicine

## 2020-08-08 VITALS — BP 118/77 | HR 87 | Ht 65.0 in | Wt 117.4 lb

## 2020-08-08 DIAGNOSIS — I10 Essential (primary) hypertension: Secondary | ICD-10-CM

## 2020-08-08 DIAGNOSIS — Z720 Tobacco use: Secondary | ICD-10-CM

## 2020-08-08 MED ORDER — LOSARTAN POTASSIUM-HCTZ 100-25 MG PO TABS: 1.0000 | ORAL_TABLET | Freq: Every day | ORAL | 6 refills | Status: DC

## 2020-08-08 MED ORDER — AMLODIPINE BESYLATE 10 MG PO TABS: 10.0000 mg | ORAL_TABLET | Freq: Every day | ORAL | 6 refills | Status: DC

## 2020-08-08 MED ORDER — CARVEDILOL 6.25 MG PO TABS: 6.2500 mg | ORAL_TABLET | Freq: Two times a day (BID) | ORAL | 6 refills | Status: DC

## 2020-08-08 NOTE — Patient Instructions (Signed)
Discontinue Losartan, discontinue Hydrochlorothiazide. Start taking Losartan/Hydrochlorothiazide

## 2020-08-08 NOTE — Progress Notes (Signed)
Subjective:  Patient ID: Christian Matthews, male    DOB: 1961/05/04  Age: 59 y.o. MRN: [id number]  CC: Hypertension   HPI Christian Matthews is a 59 year old male with a history of hypertension here for a follow-up visit.  At his last office visit his blood pressure was significantly elevated at 175/83 but his blood pressure today is 118/77. He is doing well on his antihypertensive. He continues to smoke cigarettes about 3 cigarettes a day and is not ready to quit at this time. Denies presence of chest pain, pedal edema, dyspnea.  Past Medical History:  Diagnosis Date  . Essential hypertension 03/30/2020    No past surgical history on file.  No family history on file.  No Known Allergies  Outpatient Medications Prior to Visit  Medication Sig Dispense Refill  . Multiple Vitamin (MULTIVITAMIN WITH MINERALS) TABS tablet Take 1 tablet by mouth daily. 30 tablet 0  . thiamine 100 MG tablet Take 1 tablet (100 mg total) by mouth daily. 30 tablet 0  . amLODipine (NORVASC) 10 MG tablet Take 1 tablet (10 mg total) by mouth daily. 30 tablet 3  . carvedilol (COREG) 6.25 MG tablet Take 1 tablet (6.25 mg total) by mouth 2 (two) times daily with a meal. 60 tablet 3  . hydrochlorothiazide (HYDRODIURIL) 25 MG tablet Take 1 tablet (25 mg total) by mouth daily. 30 tablet 3  . losartan (COZAAR) 100 MG tablet Take 1 tablet (100 mg total) by mouth daily. 30 tablet 3   No facility-administered medications prior to visit.     ROS Review of Systems  Constitutional: Negative for activity change and appetite change.  HENT: Negative for sinus pressure and sore throat.   Eyes: Negative for visual disturbance.  Respiratory: Negative for cough, chest tightness and shortness of breath.   Cardiovascular: Negative for chest pain and leg swelling.  Gastrointestinal: Negative for abdominal distention, abdominal pain, constipation and diarrhea.  Endocrine: Negative.   Genitourinary: Negative for dysuria.    Musculoskeletal: Negative for joint swelling and myalgias.  Skin: Negative for rash.  Allergic/Immunologic: Negative.   Neurological: Negative for weakness, light-headedness and numbness.  Psychiatric/Behavioral: Negative for dysphoric mood and suicidal ideas.    Objective:  BP 118/77   Pulse 87   Ht 5\' 5"  (1.651 m)   Wt 117 lb 6.4 oz (53.3 kg)   SpO2 99%   BMI 19.54 kg/m   BP/Weight 08/08/2020 05/03/2020 03/29/2020  Systolic BP 118 175 182  Diastolic BP 77 83 79  Wt. (Lbs) 117.4 117.4 117  BMI 19.54 19.54 19.47      Physical Exam Constitutional:      Appearance: He is well-developed.  Neck:     Vascular: No JVD.  Cardiovascular:     Rate and Rhythm: Normal rate.     Heart sounds: Normal heart sounds. No murmur heard.   Pulmonary:     Effort: Pulmonary effort is normal.     Breath sounds: Normal breath sounds. No wheezing or rales.  Chest:     Chest wall: No tenderness.  Abdominal:     General: Bowel sounds are normal. There is no distension.     Palpations: Abdomen is soft. There is no mass.     Tenderness: There is no abdominal tenderness.  Musculoskeletal:        General: Normal range of motion.     Right lower leg: No edema.     Left lower leg: No edema.  Neurological:     Mental Status:  He is alert and oriented to person, place, and time.  Psychiatric:        Mood and Affect: Mood normal.     CMP Latest Ref Rng & Units 05/03/2020 02/11/2020 01/30/2020  Glucose 65 - 99 mg/dL 254(Y) 706(C) 87  BUN 8 - 27 mg/dL 12 11 6(L)  Creatinine 0.76 - 1.27 mg/dL 3.76 2.83 1.51  Sodium 134 - 144 mmol/L 136 138 138  Potassium 3.5 - 5.2 mmol/L 3.7 4.2 3.8  Chloride 96 - 106 mmol/L 94(L) 97 99  CO2 20 - 29 mmol/L 26 23 21(L)  Calcium 8.6 - 10.2 mg/dL 10.3(H) 10.3(H) 9.5  Total Protein 6.0 - 8.5 g/dL - 8.7(H) -  Total Bilirubin 0.0 - 1.2 mg/dL - 0.4 -  Alkaline Phos 39 - 117 IU/L - 110 -  AST 0 - 40 IU/L - 67(H) -  ALT 0 - 44 IU/L - 49(H) -    Lipid Panel      Component Value Date/Time   CHOL 182 02/11/2020 0956   TRIG 71 02/11/2020 0956   HDL 98 02/11/2020 0956   CHOLHDL 1.9 02/11/2020 0956   LDLCALC 71 02/11/2020 0956    CBC    Component Value Date/Time   WBC 3.5 (L) 01/30/2020 1934   RBC 4.53 01/30/2020 1934   HGB 14.3 01/30/2020 1934   HCT 41.4 01/30/2020 1934   PLT 181 01/30/2020 1934   MCV 91.4 01/30/2020 1934   MCH 31.6 01/30/2020 1934   MCHC 34.5 01/30/2020 1934   RDW 14.0 01/30/2020 1934    No results found for: HGBA1C  Assessment & Plan:  1. Essential hypertension Controlled Counseled on blood pressure goal of less than 130/80, low-sodium, DASH diet, medication compliance, 150 minutes of moderate intensity exercise per week. Discussed medication compliance, adverse effects. - losartan-hydrochlorothiazide (HYZAAR) 100-25 MG tablet; Take 1 tablet by mouth daily.  Dispense: 30 tablet; Refill: 6 - amLODipine (NORVASC) 10 MG tablet; Take 1 tablet (10 mg total) by mouth daily.  Dispense: 30 tablet; Refill: 6 - carvedilol (COREG) 6.25 MG tablet; Take 1 tablet (6.25 mg total) by mouth 2 (two) times daily with a meal.  Dispense: 60 tablet; Refill: 6  2. Tobacco abuse Spent 3 minutes counseling on smoking cessation and he is not ready to quit at this time     Meds ordered this encounter  Medications  . losartan-hydrochlorothiazide (HYZAAR) 100-25 MG tablet    Sig: Take 1 tablet by mouth daily.    Dispense:  30 tablet    Refill:  6    Discontinue Losartan, discontinue HCTZ  . amLODipine (NORVASC) 10 MG tablet    Sig: Take 1 tablet (10 mg total) by mouth daily.    Dispense:  30 tablet    Refill:  6  . carvedilol (COREG) 6.25 MG tablet    Sig: Take 1 tablet (6.25 mg total) by mouth 2 (two) times daily with a meal.    Dispense:  60 tablet    Refill:  6    Follow-up: Return in about 6 months (around 02/05/2021) for Chronic medical conditions.       Hoy Register, MD, FAAFP. [facility] and  Wellness [place], [place] [phone]   08/08/2020, 10:40 AM

## 2020-12-29 IMAGING — DX DG CHEST 2V
2 series · 2 of 2 positions shown · non-contrast
Comparison: None.

CLINICAL DATA: 59-year-old male with hypertension.

EXAM:
CHEST - 2 VIEW

[chest pa]
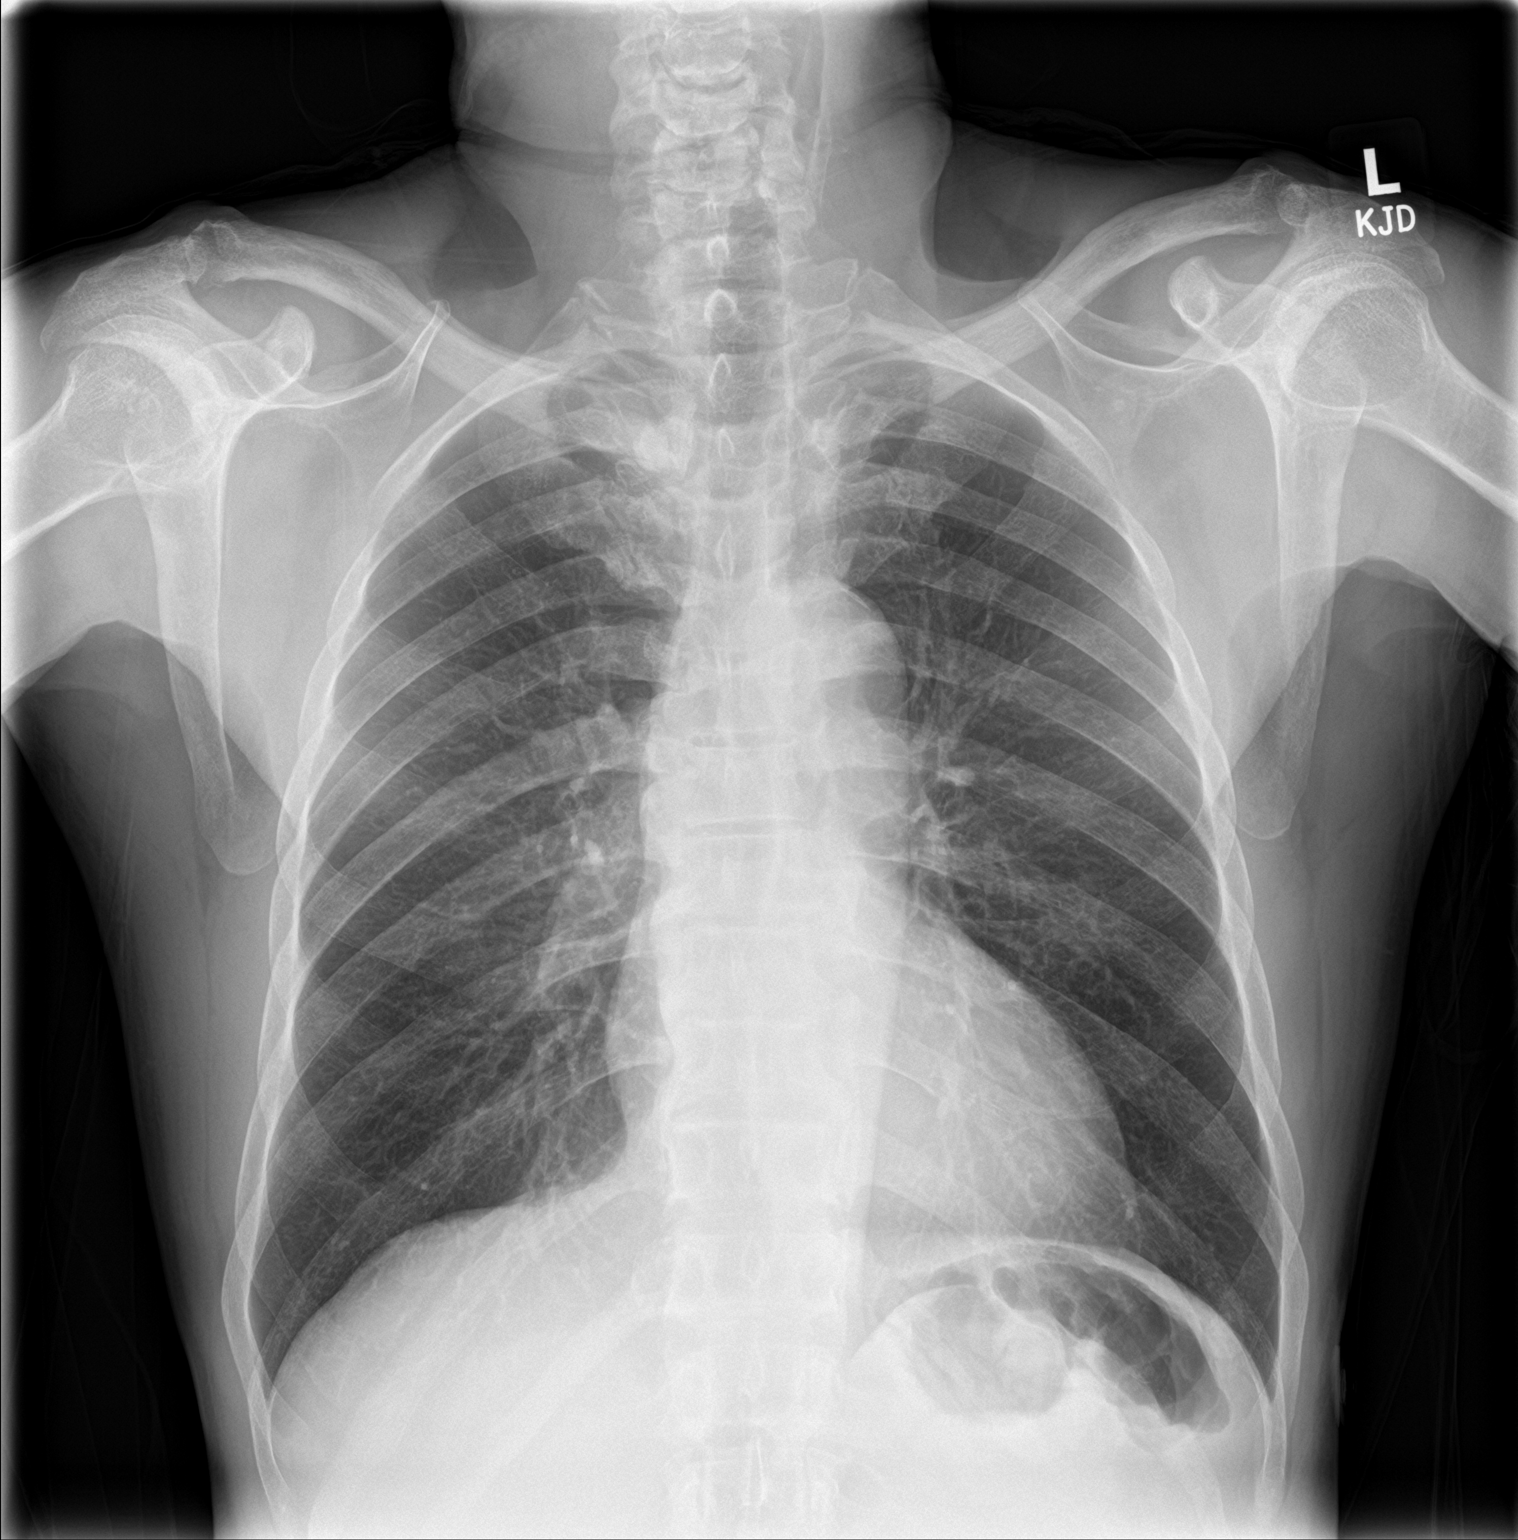

[chest lat]
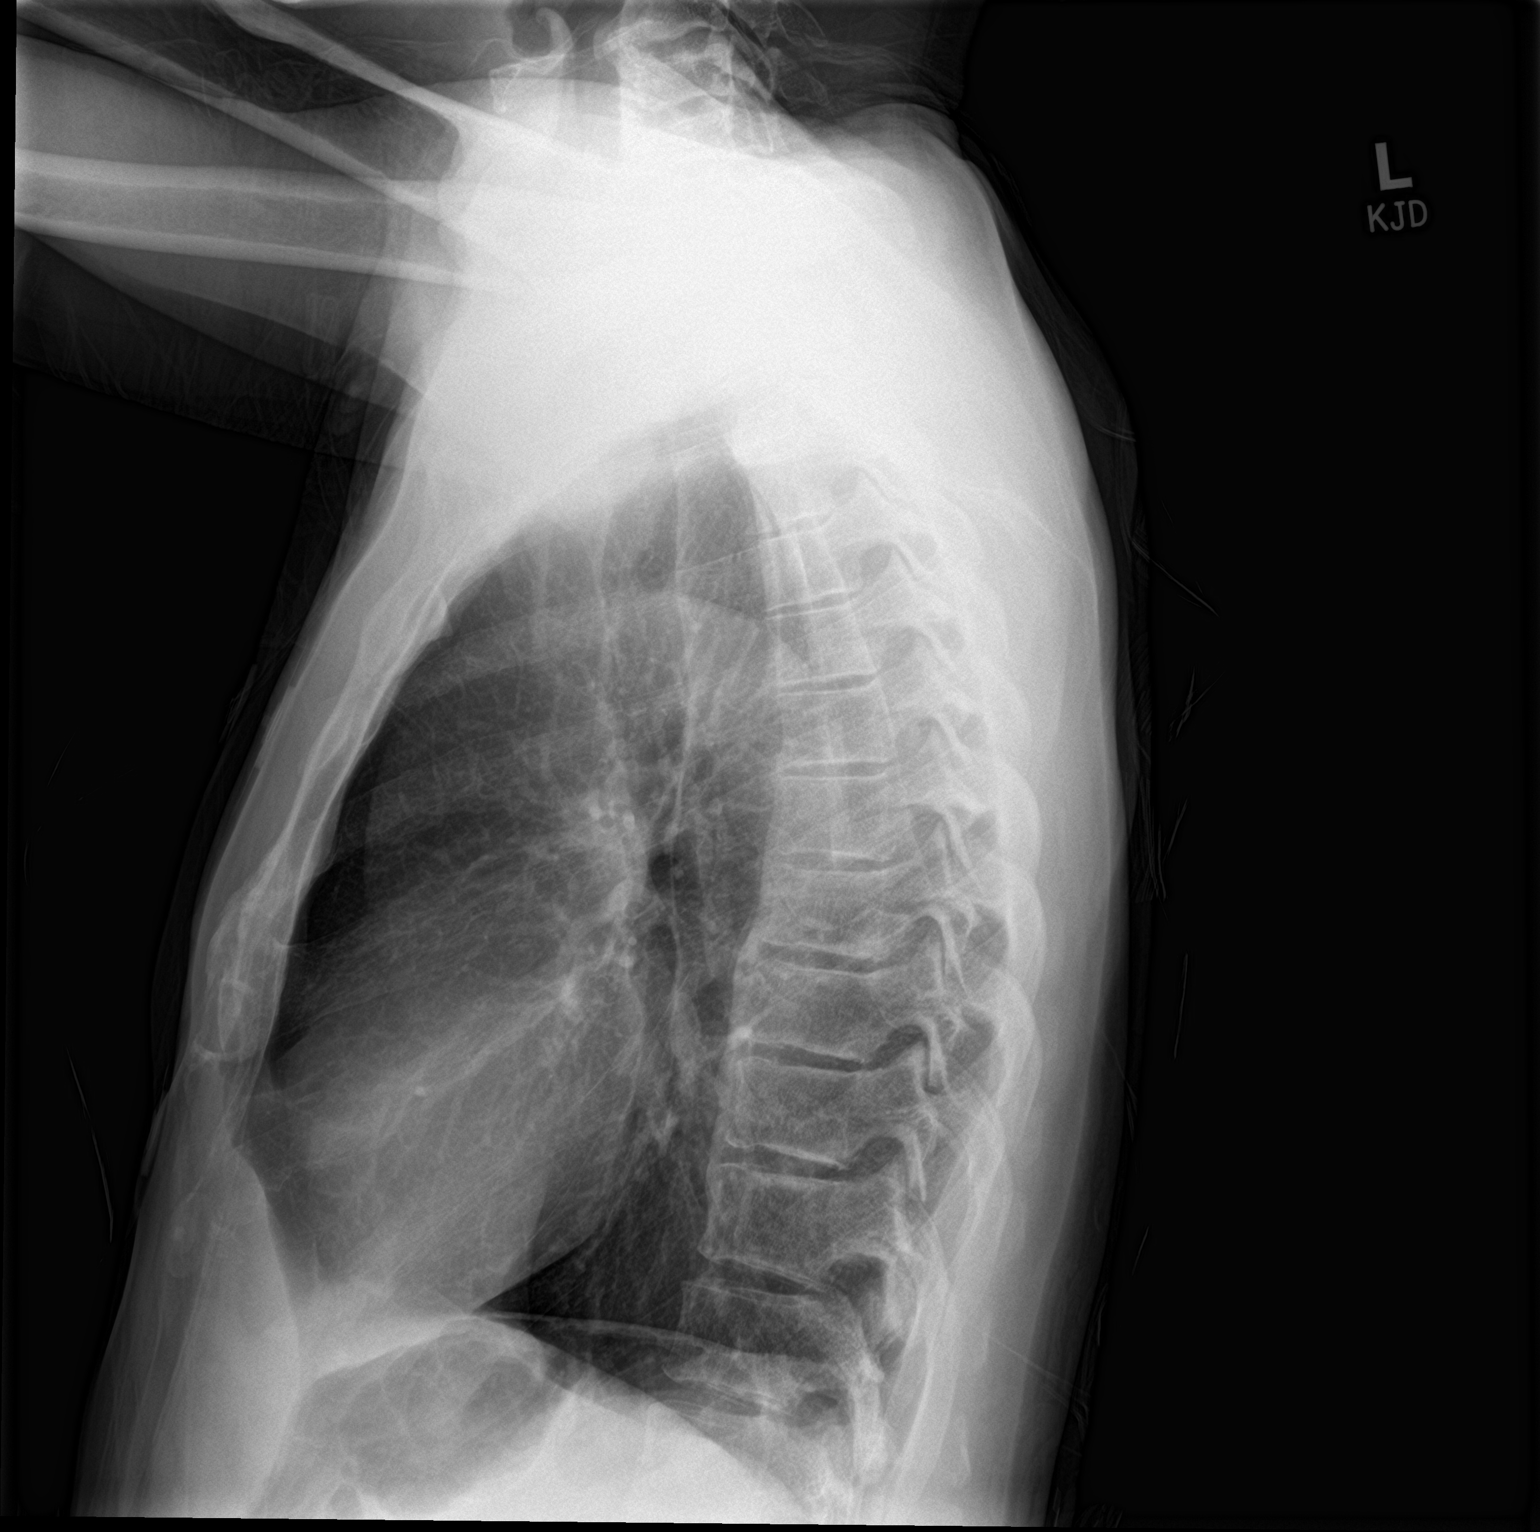

[2 of 2 positions shown; findings below may reference images not displayed]

FINDINGS: The lungs are clear. There is no pleural effusion or pneumothorax.
The cardiac silhouette is within normal limits. Mild atherosclerotic
calcification of the aortic arch. No acute osseous pathology.
IMPRESSION: No acute cardiopulmonary process.

## 2021-02-07 ENCOUNTER — Encounter: Payer: Self-pay | Admitting: Family Medicine

## 2021-02-07 ENCOUNTER — Other Ambulatory Visit: Payer: Self-pay | Admitting: Family Medicine

## 2021-02-07 ENCOUNTER — Ambulatory Visit: Payer: Self-pay | Attending: Family Medicine | Admitting: Family Medicine

## 2021-02-07 VITALS — BP 216/102 | HR 102 | Resp 20 | Ht 65.0 in | Wt 123.4 lb

## 2021-02-07 DIAGNOSIS — I10 Essential (primary) hypertension: Secondary | ICD-10-CM

## 2021-02-07 DIAGNOSIS — Z1159 Encounter for screening for other viral diseases: Secondary | ICD-10-CM

## 2021-02-07 DIAGNOSIS — Z1211 Encounter for screening for malignant neoplasm of colon: Secondary | ICD-10-CM

## 2021-02-07 DIAGNOSIS — Z720 Tobacco use: Secondary | ICD-10-CM

## 2021-02-07 MED ORDER — LOSARTAN POTASSIUM-HCTZ 100-25 MG PO TABS: 1.0000 | ORAL_TABLET | Freq: Every day | ORAL | 6 refills | Status: DC

## 2021-02-07 MED ORDER — CARVEDILOL 6.25 MG PO TABS: 6.2500 mg | ORAL_TABLET | Freq: Two times a day (BID) | ORAL | 6 refills | Status: DC

## 2021-02-07 MED ORDER — CLONIDINE HCL 0.1 MG PO TABS
0.1000 mg | ORAL_TABLET | Freq: Once | ORAL | Status: AC
  Administered 2021-02-07: 0.1 mg via ORAL

## 2021-02-07 MED ORDER — AMLODIPINE BESYLATE 10 MG PO TABS: 10.0000 mg | ORAL_TABLET | Freq: Every day | ORAL | 6 refills | Status: DC

## 2021-02-07 MED FILL — CARVEDILOL 6.25 MG TABLET: 6.25 | 30 days supply | Qty: 60 | Fill #0

## 2021-02-07 MED FILL — AMLODIPINE BESYLATE 10 MG T: 10 | 30 days supply | Qty: 30 | Fill #0

## 2021-02-07 MED FILL — LOSARTAN-HCTZ 100-25 MG TAB: 100-25 | 30 days supply | Qty: 30 | Fill #0

## 2021-02-07 NOTE — Patient Instructions (Signed)

## 2021-02-07 NOTE — Progress Notes (Signed)
Subjective:  Patient ID: Christian Matthews, male    DOB: 1961-06-08  Age: 61 y.o. MRN: [id number]  CC: Hypertension   HPI Christian Matthews is a 60 year old male with a history of hypertension and tobacco use who is here today for a 6 month follow up visit. His last blood pressure revealed controlled hypertension with a blood pressure of 118/77, but upon arrival today he presented with a blood pressure of 216/102. The pt reported not taking medications today as well as several other times over the past month due to financial constraints.   In regards to his smoking, the pt reported smoking roughly 2 cigarettes/day with no interests currently of quitting. He reported drinking 40 oz of beer a "few times per week."  The pt denied any SOB, chest pain, pedal edema, cough or headaches.   Past Medical History:  Diagnosis Date  . Essential hypertension 03/30/2020    No past surgical history on file.  No family history on file.  No Known Allergies  Outpatient Medications Prior to Visit  Medication Sig Dispense Refill  . Multiple Vitamin (MULTIVITAMIN WITH MINERALS) TABS tablet Take 1 tablet by mouth daily. 30 tablet 0  . thiamine 100 MG tablet Take 1 tablet (100 mg total) by mouth daily. 30 tablet 0  . amLODipine (NORVASC) 10 MG tablet Take 1 tablet (10 mg total) by mouth daily. 30 tablet 6  . carvedilol (COREG) 6.25 MG tablet Take 1 tablet (6.25 mg total) by mouth 2 (two) times daily with a meal. 60 tablet 6  . losartan-hydrochlorothiazide (HYZAAR) 100-25 MG tablet Take 1 tablet by mouth daily. 30 tablet 6   No facility-administered medications prior to visit.     ROS Review of Systems Constitutional: Negative for activity change and appetite change.  HENT: Negative for sinus pressure and sore throat.   Eyes: Negative for visual disturbance.  Respiratory: Negative for cough, chest tightness and shortness of breath.   Cardiovascular: Negative for chest pain and leg swelling.  Gastrointestinal:  Negative for abdominal distention, abdominal pain, constipation and diarrhea.  Endocrine: Negative.   Genitourinary: Negative for dysuria.  Musculoskeletal: Negative for joint swelling and myalgias.  Skin: Negative for rash.  Allergic/Immunologic: Negative.   Neurological: Negative for weakness, light-headedness and numbness.  Psychiatric/Behavioral: Negative for dysphoric mood and suicidal ideas  Objective:  BP (!) 216/102   Pulse (!) 102   Resp 20   Ht 5' 5"  (1.651 m)   Wt 123 lb 6.4 oz (56 kg)   SpO2 100%   BMI 20.53 kg/m   BP/Weight 02/07/2021 08/08/2020 2/91/9166  Systolic BP 060 045 997  Diastolic BP 741 77 83  Wt. (Lbs) 123.4 117.4 117.4  BMI 20.53 19.54 19.54      Physical Exam  Constitutional:      Appearance: He is well-developed.  Neck:     Vascular: No JVD.  Cardiovascular:     Rate and Rhythm: Normal rate.     Heart sounds: Normal heart sounds. No murmur heard.  Pulmonary:     Effort: Pulmonary effort is normal.     Breath sounds: Normal breath sounds. No wheezing or rales.  Chest:     Chest wall: No tenderness.  Abdominal:     General: Bowel sounds are normal. There is no distension.     Palpations: Abdomen is soft. There is no mass.     Tenderness: There is no abdominal tenderness.  Musculoskeletal:        General: Normal range  of motion.     Right lower leg: No edema.     Left lower leg: No edema.  Neurological:     Mental Status: He is alert and oriented to person, place, and time.  Psychiatric:        Mood and Affect: Mood normal.  CMP Latest Ref Rng & Units 05/03/2020 02/11/2020 01/30/2020  Glucose 65 - 99 mg/dL 101(H) 107(H) 87  BUN 8 - 27 mg/dL 12 11 6(L)  Creatinine 0.76 - 1.27 mg/dL 0.85 0.91 0.83  Sodium 134 - 144 mmol/L 136 138 138  Potassium 3.5 - 5.2 mmol/L 3.7 4.2 3.8  Chloride 96 - 106 mmol/L 94(L) 97 99  CO2 20 - 29 mmol/L 26 23 21(L)  Calcium 8.6 - 10.2 mg/dL 10.3(H) 10.3(H) 9.5  Total Protein 6.0 - 8.5 g/dL - 8.7(H) -  Total  Bilirubin 0.0 - 1.2 mg/dL - 0.4 -  Alkaline Phos 39 - 117 IU/L - 110 -  AST 0 - 40 IU/L - 67(H) -  ALT 0 - 44 IU/L - 49(H) -    Lipid Panel     Component Value Date/Time   CHOL 182 02/11/2020 0956   TRIG 71 02/11/2020 0956   HDL 98 02/11/2020 0956   CHOLHDL 1.9 02/11/2020 0956   LDLCALC 71 02/11/2020 0956    CBC    Component Value Date/Time   WBC 3.5 (L) 01/30/2020 1934   RBC 4.53 01/30/2020 1934   HGB 14.3 01/30/2020 1934   HCT 41.4 01/30/2020 1934   PLT 181 01/30/2020 1934   MCV 91.4 01/30/2020 1934   MCH 31.6 01/30/2020 1934   MCHC 34.5 01/30/2020 1934   RDW 14.0 01/30/2020 1934    No results found for: HGBA1C  Assessment & Plan:  1. Essential hypertension Uncontrolled secondary to nonadherence Administered clonidine tablet (0.1 mg) in office today to stabilize blood pressure. Pt explained he was not driving following visit and counseled him on potential side effects of clonidine. Blood pressure came down to 190/187 following clonidine administration. Pt will return in 1 week to meet with clinical pharmacist for blood pressure evaluation Counseled patient on the need for medication adherence as well as financial assistance to afford medication Counseled on blood pressure goal of less than 130/80, low-sodium, DASH diet, medication compliance, 150 minutes of moderate intensity exercise per week.  - cloNIDine (CATAPRES) tablet 0.1 mg - amLODipine (NORVASC) 10 MG tablet; Take 1 tablet (10 mg total) by mouth daily.  Dispense: 30 tablet; Refill: 6 - carvedilol (COREG) 6.25 MG tablet; Take 1 tablet (6.25 mg total) by mouth 2 (two) times daily with a meal.  Dispense: 60 tablet; Refill: 6 - losartan-hydrochlorothiazide (HYZAAR) 100-25 MG tablet; Take 1 tablet by mouth daily.  Dispense: 30 tablet; Refill: 6 - CMP14+EGFR - Lipid panel  2. Need for hepatitis C screening test - HCV RNA quant rflx ultra or genotyp([facility]/Sunquest)  3. Screening for colon cancer - Ordered FIT  test for patient and was counseled that this will be a yearly test - Fecal occult blood, imunochemical([facility]/Sunquest)  4. Screening for lung cancer - Pt is currently uninsured, but received the necessary paperwork today to obtain financial assistance. Will discuss screening again in 6 months at his next follow up visit once financial needs have been resolved.   5. Tobacco abuse Spent 3 minutes counseling on smoking cessation and reports he is slowly cutting back but not ready to fully quit at the moment. Counseled patient on the adverse affects and consequences of  long term smoking.     Meds ordered this encounter  Medications  . cloNIDine (CATAPRES) tablet 0.1 mg  . amLODipine (NORVASC) 10 MG tablet    Sig: Take 1 tablet (10 mg total) by mouth daily.    Dispense:  30 tablet    Refill:  6  . carvedilol (COREG) 6.25 MG tablet    Sig: Take 1 tablet (6.25 mg total) by mouth 2 (two) times daily with a meal.    Dispense:  60 tablet    Refill:  6  . losartan-hydrochlorothiazide (HYZAAR) 100-25 MG tablet    Sig: Take 1 tablet by mouth daily.    Dispense:  30 tablet    Refill:  6    Discontinue Losartan, discontinue HCTZ    Follow-up: Return in about 1 week (around 02/14/2021) for Blood pressure evaluation with Lurena Joiner; 6 months with PCP.     [place] [facility] [place], [place]   02/07/2021, 11:01 AM   Evaluation and management procedures were performed by me with Medical Student in attendance, note written by Medical Student under my supervision and collaboration. I have reviewed the note and I agree with the management and plan.  In summary Mr. Loiseau presents for chronic disease management and just picked up his medications from the pharmacy this morning and is yet to take them. Repeat blood pressure after 30 minutes of administration of clonidine and observation returned at 190/87. No regimen change will  be made at this time but he will be reassessed at his clinical pharmacy visit.  Charlott Rakes, MD, FAAFP. [facility] and [place], [place]   02/07/2021, 11:51 AM

## 2021-02-07 NOTE — Progress Notes (Signed)
Pt has not been taking medications daily.

## 2021-02-09 LAB — CMP14+EGFR
ALT: 72 IU/L — ABNORMAL HIGH (ref 0–44)
AST: 85 IU/L — ABNORMAL HIGH (ref 0–40)
Albumin/Globulin Ratio: 1.4 (ref 1.2–2.2)
Albumin: 5 g/dL — ABNORMAL HIGH (ref 3.8–4.8)
Alkaline Phosphatase: 105 IU/L (ref 44–121)
BUN/Creatinine Ratio: 11 (ref 10–24)
BUN: 9 mg/dL (ref 8–27)
Bilirubin Total: 0.4 mg/dL (ref 0.0–1.2)
CO2: 21 mmol/L (ref 20–29)
Calcium: 10.1 mg/dL (ref 8.6–10.2)
Chloride: 101 mmol/L (ref 96–106)
Creatinine, Ser: 0.84 mg/dL (ref 0.76–1.27)
Globulin, Total: 3.6 g/dL (ref 1.5–4.5)
Glucose: 85 mg/dL (ref 65–99)
Potassium: 4.2 mmol/L (ref 3.5–5.2)
Sodium: 142 mmol/L (ref 134–144)
Total Protein: 8.6 g/dL — ABNORMAL HIGH (ref 6.0–8.5)
eGFR: 99 mL/min/{1.73_m2} (ref >59)

## 2021-02-09 LAB — HCV RNA QUANT RFLX ULTRA OR GENOTYP: HCV Quant Baseline: NOT DETECTED IU/mL

## 2021-02-09 LAB — LIPID PANEL
Chol/HDL Ratio: 2.5 ratio (ref 0.0–5.0)
Cholesterol, Total: 204 mg/dL — ABNORMAL HIGH (ref 100–199)
HDL: 81 mg/dL (ref >39)
LDL Chol Calc (NIH): 101 mg/dL — ABNORMAL HIGH (ref 0–99)
Triglycerides: 126 mg/dL (ref 0–149)
VLDL Cholesterol Cal: 22 mg/dL (ref 5–40)

## 2021-02-10 ENCOUNTER — Telehealth: Payer: Self-pay

## 2021-02-10 NOTE — Telephone Encounter (Signed)
Patient was called and a voicemail was left informing patient to return phone call for lab results. 

## 2021-02-10 NOTE — Telephone Encounter (Signed)
-----   Message from Hoy Register, MD sent at 02/10/2021 12:27 PM EDT ----- Please inform him that his kidney function and electrolytes are normal.  Liver enzymes are slightly elevated.  If he consumes alcohol please advised to work on quitting.  Cholesterol is also slightly elevated.  Please advised to work on low-cholesterol diet

## 2021-02-13 NOTE — Telephone Encounter (Signed)
-----   Message from Enobong Newlin, MD sent at 02/10/2021 12:27 PM EDT ----- Please inform him that his kidney function and electrolytes are normal.  Liver enzymes are slightly elevated.  If he consumes alcohol please advised to work on quitting.  Cholesterol is also slightly elevated.  Please advised to work on low-cholesterol diet  

## 2021-02-13 NOTE — Telephone Encounter (Signed)
Patient verified DOB Patient is aware of mineral levels and kidney function being normal. Patient is advised to adhere to a low cholesterol diet and stop drinking to address elevated liver level and elevated cholesterol.

## 2021-02-15 ENCOUNTER — Encounter: Payer: Self-pay | Admitting: Pharmacist

## 2021-02-15 ENCOUNTER — Ambulatory Visit: Payer: Self-pay | Attending: Family Medicine | Admitting: Pharmacist

## 2021-02-15 ENCOUNTER — Other Ambulatory Visit: Payer: Self-pay

## 2021-02-15 VITALS — BP 137/78

## 2021-02-15 DIAGNOSIS — I10 Essential (primary) hypertension: Secondary | ICD-10-CM

## 2021-02-15 NOTE — Progress Notes (Signed)
   S:    PCP: Dr. Alvis Lemmings   Patient arrives in good spirits. Presents to the clinic for hypertension evaluation, counseling, and management. Patient was referred and last seen by Primary Care Provider on 02/07/21.  At that visit, BP was 216/102 but patient had not had medication in multiple days due to financial constraints. Was restarted on amlodipine, losartan/HCTZ, and carvedilol at this visit.  Today, medication adherence reported. Denies missed doses since the PCP visit last week. Did report some blurry vision when waking up but goes away throughout the day. Denies HA, dizziness, SOB.  Current BP Medications include: losartan/hctz 100/25 mg daily, amlodipine 10 mg daily, carvedilol 6.25 mg BID  Antihypertensives tried in the past include: none  Dietary habits include: tries to eat well and avoids salt as much as possible Exercise habits include: Family History: none Tobacco: 2 cigarettes a day Alcohol: drinks a 40 oz beer a few times a week  O:  Home BP readings: not checking  Today's Vitals   02/15/21 1118  BP: 137/78   Last 3 Office BP readings: BP Readings from Last 3 Encounters:  02/15/21 137/78  02/07/21 (!) 216/102  08/08/20 118/77    BMET    Component Value Date/Time   NA 142 02/07/2021 1109   K 4.2 02/07/2021 1109   CL 101 02/07/2021 1109   CO2 21 02/07/2021 1109   GLUCOSE 85 02/07/2021 1109   GLUCOSE 87 01/30/2020 1934   BUN 9 02/07/2021 1109   CREATININE 0.84 02/07/2021 1109   CALCIUM 10.1 02/07/2021 1109   GFRNONAA 94 05/03/2020 1510   GFRAA 109 05/03/2020 1510    Renal function: Estimated Creatinine Clearance: 72.2 mL/min (by C-G formula based on SCr of 0.84 mg/dL).  Clinical ASCVD: No  The 10-year ASCVD risk score Denman George DC Jr., et al., 2013) is: 23.4%   Values used to calculate the score:     Age: 60 years     Sex: Male     Is Non-Hispanic African American: Yes     Diabetic: No     Tobacco smoker: Yes     Systolic Blood Pressure: 137 mmHg      Is BP treated: Yes     HDL Cholesterol: 81 mg/dL     Total Cholesterol: 204 mg/dL  A/P: Hypertension longstanding currently uncontrolled on current medications. BP Goal = <130/80 mmHg. Medication adherence reported. Patient has been trying to control diet and tries to walk at least 30 minutes a day. Counseled patient to continue being adherent with medications and trying to avoid salt intake as much as possible. BP has greatly reduced since last PCP visit and is near goal so no changes were made today. -Continued amlodipine 10 mg daily -Continued losartan-hctz 100/25 mg daily -Continued carvedilol 6.25 mg BID -Counseled on lifestyle modifications for blood pressure control including reduced dietary sodium, increased exercise, adequate sleep.  Results reviewed and written information provided.   Total time in face-to-face counseling 15 minutes.   F/U Clinic Visit in 4 weeks.    Patient seen with:  Paulino Door, PharmD Candidate [employer]  Butch Penny, PharmD, [place], CPP Clinical Pharmacist [facility] [phone]

## 2021-03-15 ENCOUNTER — Ambulatory Visit: Payer: Self-pay | Attending: Family Medicine | Admitting: Pharmacist

## 2021-03-15 ENCOUNTER — Other Ambulatory Visit: Payer: Self-pay

## 2021-03-15 ENCOUNTER — Ambulatory Visit: Payer: Self-pay | Attending: Family Medicine

## 2021-03-15 ENCOUNTER — Encounter: Payer: Self-pay | Admitting: Pharmacist

## 2021-03-15 VITALS — BP 146/81 | HR 80

## 2021-03-15 DIAGNOSIS — I10 Essential (primary) hypertension: Secondary | ICD-10-CM

## 2021-03-15 MED FILL — Carvedilol Tab 6.25 MG: ORAL | 30 days supply | Qty: 60 | Fill #0 | Status: AC

## 2021-03-15 MED FILL — Losartan Potassium & Hydrochlorothiazide Tab 100-25 MG: ORAL | 30 days supply | Qty: 30 | Fill #0 | Status: AC

## 2021-03-15 MED FILL — Amlodipine Besylate Tab 10 MG (Base Equivalent): ORAL | 30 days supply | Qty: 30 | Fill #0 | Status: AC

## 2021-03-15 NOTE — Progress Notes (Signed)
   S:     PCP: D. Newlin  Patient arrives in good spirits. Presents to the clinic for hypertension evaluation, counseling, and management. Patient was referred and last seen by Primary Care Provider on 02/07/21. Pt last seen by pharmacy on 02/15/21. At that visit, BP was 137/78 and pt reported medication compliance since restarting all BP medications. No medication changes were made.  Today, patient reports running out of one of his daily BP medications this morning (losartan-HCTZ or amlodipine). Reports taking the other daily BP medication and carvedilol this morning. Reports no missed doses in the last two weeks. Denies dizziness, headaches, blurry vision, and LE swelling. Report not checking BP at home.   Medication adherence reported fair.   Current BP Medications include: losartan/hctz 100/25 mg daily, amlodipine 10 mg daily, carvedilol 6.25 mg BID  Antihypertensives tried in the past include: none  Dietary habits include: tries to eat well and avoids salt as much as possible Exercise habits include: Family History: none Tobacco: 2 cigarettes a day Alcohol: drinks a 40 oz beer a few times a week   O:  Vitals:   03/15/21 1013  BP: (!) 146/81  Pulse: 80    Home BP readings: not checking  Last 3 Office BP readings: BP Readings from Last 3 Encounters:  03/15/21 (!) 146/81  02/15/21 137/78  02/07/21 (!) 216/102    BMET    Component Value Date/Time   NA 142 02/07/2021 1109   K 4.2 02/07/2021 1109   CL 101 02/07/2021 1109   CO2 21 02/07/2021 1109   GLUCOSE 85 02/07/2021 1109   GLUCOSE 87 01/30/2020 1934   BUN 9 02/07/2021 1109   CREATININE 0.84 02/07/2021 1109   CALCIUM 10.1 02/07/2021 1109   GFRNONAA 94 05/03/2020 1510   GFRAA 109 05/03/2020 1510    Renal function: CrCl cannot be calculated (Patient's most recent lab result is older than the maximum 21 days allowed.).  Clinical ASCVD: No  The 10-year ASCVD risk score Denman George DC Jr., et al., 2013) is: 26%    Values used to calculate the score:     Age: 60 years     Sex: Male     Is Non-Hispanic African American: Yes     Diabetic: No     Tobacco smoker: Yes     Systolic Blood Pressure: 146 mmHg     Is BP treated: Yes     HDL Cholesterol: 81 mg/dL     Total Cholesterol: 204 mg/dL   A/P: Hypertension longstanding currently uncontrolled on current medications. BP Goal = <130/80 mmHg. Medication adherence appears fair. Encouraged patient to start picking up refills about 1 week prior to the next fill. Encouraged patient to continue to take medications as prescribed. Patient verbalized understanding. -No medication changes today -Continued amlodipine 10 mg daily -Continued losartan-HCTZ 100/25 mg daily -Continued carvedilol 6.25 mg BID -F/u labs ordered - BMET today since restarting losartan-HCTZ -Counseled on lifestyle modifications for blood pressure control including reduced dietary sodium, increased exercise, adequate sleep.  Results reviewed and written information provided. Total time in face-to-face counseling 15 minutes.   F/U Clinic Visit in 4 weeks.    Fabio Neighbors, PharmD, BCPS PGY2 Ambulatory Care Resident [facility]  Pharmacy

## 2021-03-16 LAB — BASIC METABOLIC PANEL
BUN/Creatinine Ratio: 11 (ref 10–24)
BUN: 11 mg/dL (ref 8–27)
CO2: 24 mmol/L (ref 20–29)
Calcium: 10.4 mg/dL — ABNORMAL HIGH (ref 8.6–10.2)
Chloride: 100 mmol/L (ref 96–106)
Creatinine, Ser: 0.98 mg/dL (ref 0.76–1.27)
Glucose: 82 mg/dL (ref 65–99)
Potassium: 3.7 mmol/L (ref 3.5–5.2)
Sodium: 142 mmol/L (ref 134–144)
eGFR: 87 mL/min/{1.73_m2} (ref >59)

## 2021-03-17 ENCOUNTER — Telehealth: Payer: Self-pay

## 2021-03-17 NOTE — Telephone Encounter (Signed)
Patient was called and a voicemail was left informing patient to return phone call for lab results.   CRM created. 

## 2021-03-17 NOTE — Telephone Encounter (Signed)
-----   Message from Hoy Register, MD sent at 03/16/2021  9:13 AM EDT ----- This inform him that his labs are stable.

## 2021-03-21 ENCOUNTER — Other Ambulatory Visit: Payer: Self-pay

## 2021-04-14 ENCOUNTER — Other Ambulatory Visit: Payer: Self-pay

## 2021-04-14 ENCOUNTER — Ambulatory Visit: Payer: Self-pay | Attending: Family Medicine | Admitting: Pharmacist

## 2021-04-14 VITALS — BP 104/66 | HR 80

## 2021-04-14 DIAGNOSIS — I10 Essential (primary) hypertension: Secondary | ICD-10-CM

## 2021-04-14 NOTE — Progress Notes (Signed)
   S:     PCP: D. Newlin  Patient arrives in good spirits. Presents to the clinic for hypertension evaluation, counseling, and management. Patient was referred and last seen by Primary Care Provider on 02/07/21. Pt last seen by pharmacy on 03/15/2021.  Today, patient endorses medication compliance. Denies missed doses. Denies dizziness, headaches, blurry vision, and LE swelling. Does not check BP at home.   Medication adherence reported.   Current BP Medications include: losartan/hctz 100/25 mg daily, amlodipine 10 mg daily, carvedilol 6.25 mg BID  Antihypertensives tried in the past include: none  Dietary habits include: tries to eat well and avoids salt as much as possible Exercise habits include: Family History: none Tobacco: 2 cigarettes a day Alcohol: drinks a 40 oz beer a few times a week   O:  Vitals:   04/14/21 1021  BP: 104/66  Pulse: 80   Home BP readings: not checking  Last 3 Office BP readings: BP Readings from Last 3 Encounters:  04/14/21 104/66  03/15/21 (!) 146/81  02/15/21 137/78    BMET    Component Value Date/Time   NA 142 03/15/2021 1025   K 3.7 03/15/2021 1025   CL 100 03/15/2021 1025   CO2 24 03/15/2021 1025   GLUCOSE 82 03/15/2021 1025   GLUCOSE 87 01/30/2020 1934   BUN 11 03/15/2021 1025   CREATININE 0.98 03/15/2021 1025   CALCIUM 10.4 (H) 03/15/2021 1025   GFRNONAA 94 05/03/2020 1510   GFRAA 109 05/03/2020 1510    Renal function: CrCl cannot be calculated (Patient's most recent lab result is older than the maximum 21 days allowed.).  Clinical ASCVD: No  The 10-year ASCVD risk score Denman George DC Jr., et al., 2013) is: 14.6%   Values used to calculate the score:     Age: 60 years     Sex: Male     Is Non-Hispanic African American: Yes     Diabetic: No     Tobacco smoker: Yes     Systolic Blood Pressure: 104 mmHg     Is BP treated: Yes     HDL Cholesterol: 81 mg/dL     Total Cholesterol: 204 mg/dL   A/P: Hypertension longstanding  currently at goal on current medications. BP Goal = <130/80 mmHg. Medication adherence reported. -No medication changes today -Continued amlodipine 10 mg daily -Continued losartan-HCTZ 100/25 mg daily -Continued carvedilol 6.25 mg BID -Counseled on lifestyle modifications for blood pressure control including reduced dietary sodium, increased exercise, adequate sleep.  Results reviewed and written information provided. Total time in face-to-face counseling 15 minutes.   F/U Clinic Visit with PCP.    Butch Penny, PharmD, [place], CPP Clinical Pharmacist [facility] & [facility] [phone]

## 2021-04-25 ENCOUNTER — Other Ambulatory Visit: Payer: Self-pay

## 2021-04-25 MED FILL — Carvedilol Tab 6.25 MG: ORAL | 30 days supply | Qty: 60 | Fill #1 | Status: CN

## 2021-04-25 MED FILL — Losartan Potassium & Hydrochlorothiazide Tab 100-25 MG: ORAL | 30 days supply | Qty: 30 | Fill #1 | Status: CN

## 2021-04-25 MED FILL — Amlodipine Besylate Tab 10 MG (Base Equivalent): ORAL | 30 days supply | Qty: 30 | Fill #1 | Status: CN

## 2021-05-02 ENCOUNTER — Other Ambulatory Visit: Payer: Self-pay

## 2021-05-09 ENCOUNTER — Other Ambulatory Visit: Payer: Self-pay

## 2021-05-09 MED FILL — Carvedilol Tab 6.25 MG: ORAL | 30 days supply | Qty: 60 | Fill #1 | Status: AC

## 2021-05-09 MED FILL — Losartan Potassium & Hydrochlorothiazide Tab 100-25 MG: ORAL | 30 days supply | Qty: 30 | Fill #1 | Status: AC

## 2021-05-09 MED FILL — Amlodipine Besylate Tab 10 MG (Base Equivalent): ORAL | 30 days supply | Qty: 30 | Fill #1 | Status: AC

## 2021-06-27 ENCOUNTER — Other Ambulatory Visit: Payer: Self-pay

## 2021-06-27 ENCOUNTER — Encounter: Payer: Self-pay | Admitting: Family Medicine

## 2021-06-27 ENCOUNTER — Ambulatory Visit: Payer: Self-pay | Attending: Family Medicine | Admitting: Family Medicine

## 2021-06-27 VITALS — BP 153/75 | HR 82 | Ht 65.0 in | Wt 114.0 lb

## 2021-06-27 DIAGNOSIS — Z720 Tobacco use: Secondary | ICD-10-CM

## 2021-06-27 DIAGNOSIS — I10 Essential (primary) hypertension: Secondary | ICD-10-CM

## 2021-06-27 DIAGNOSIS — Z1211 Encounter for screening for malignant neoplasm of colon: Secondary | ICD-10-CM

## 2021-06-27 MED ORDER — NICOTINE 7 MG/24HR TD PT24
7.0000 mg | MEDICATED_PATCH | Freq: Every day | TRANSDERMAL | 1 refills | Status: DC
  Filled 2021-06-27: qty 28, 28d supply, fill #0

## 2021-06-27 NOTE — Progress Notes (Signed)
Subjective:  Patient ID: Christian Matthews, male    DOB: 31-Dec-1960  Age: 60 y.o. MRN: [id number]  CC: Hypertension   HPI Christian Matthews is a 60 y.o. year old male with a history of hypertension, tobacco abuse who presents today for chronic disease management.  Interval History: Bp is elevated at 162/75 but was controlled at 104/66, 3 months ago.  States he has been compliant with his antihypertensives.  He checks his blood pressures at home occasionally but is unable to recall actual numbers.  He endorses eating a sausage this morning and smoking a cigarette. Continues to smokes about a cigarette per day here and there and is willing to work on quitting. He has no additional concerns today. Past Medical History:  Diagnosis Date   Essential hypertension 03/30/2020    History reviewed. No pertinent surgical history.  History reviewed. No pertinent family history.  No Known Allergies  Outpatient Medications Prior to Visit  Medication Sig Dispense Refill   amLODipine (NORVASC) 10 MG tablet TAKE 1 TABLET (10 MG TOTAL) BY MOUTH DAILY. 30 tablet 6   carvedilol (COREG) 6.25 MG tablet TAKE 1 TABLET (6.25 MG TOTAL) BY MOUTH 2 (TWO) TIMES DAILY WITH A MEAL. 60 tablet 6   losartan-hydrochlorothiazide (HYZAAR) 100-25 MG tablet TAKE 1 TABLET BY MOUTH DAILY. 30 tablet 6   Multiple Vitamin (MULTIVITAMIN WITH MINERALS) TABS tablet Take 1 tablet by mouth daily. 30 tablet 0   thiamine 100 MG tablet Take 1 tablet (100 mg total) by mouth daily. 30 tablet 0   No facility-administered medications prior to visit.     ROS Review of Systems  Constitutional:  Negative for activity change and appetite change.  HENT:  Negative for sinus pressure and sore throat.   Eyes:  Negative for visual disturbance.  Respiratory:  Negative for cough, chest tightness and shortness of breath.   Cardiovascular:  Negative for chest pain and leg swelling.  Gastrointestinal:  Negative for abdominal distention, abdominal pain,  constipation and diarrhea.  Endocrine: Negative.   Genitourinary:  Negative for dysuria.  Musculoskeletal:  Negative for joint swelling and myalgias.  Skin:  Negative for rash.  Allergic/Immunologic: Negative.   Neurological:  Negative for weakness, light-headedness and numbness.  Psychiatric/Behavioral:  Negative for dysphoric mood and suicidal ideas.    Objective:  BP (!) 153/75   Pulse 82   Ht 5\' 5"  (1.651 m)   Wt 114 lb (51.7 kg)   SpO2 100%   BMI 18.97 kg/m   BP/Weight 06/27/2021 04/14/2021 03/15/2021  Systolic BP 153 104 146  Diastolic BP 75 66 81  Wt. (Lbs) 114 - -  BMI 18.97 - -      Physical Exam Constitutional:      Appearance: He is well-developed.  Cardiovascular:     Rate and Rhythm: Normal rate.     Heart sounds: Normal heart sounds. No murmur heard. Pulmonary:     Effort: Pulmonary effort is normal.     Breath sounds: Normal breath sounds. No wheezing or rales.  Chest:     Chest wall: No tenderness.  Abdominal:     General: Bowel sounds are normal. There is no distension.     Palpations: Abdomen is soft. There is no mass.     Tenderness: There is no abdominal tenderness.  Musculoskeletal:        General: Normal range of motion.     Right lower leg: No edema.     Left lower leg: No edema.  Neurological:  Mental Status: He is alert and oriented to person, place, and time.  Psychiatric:        Mood and Affect: Mood normal.    CMP Latest Ref Rng & Units 03/15/2021 02/07/2021 05/03/2020  Glucose 65 - 99 mg/dL 82 85 496(P)  BUN 8 - 27 mg/dL 11 9 12   Creatinine 0.76 - 1.27 mg/dL 5.91 6.38  Sodium 134 - 144 mmol/L 142 142 136  Potassium 3.5 - 5.2 mmol/L 3.7 4.2 3.7  Chloride 96 - 106 mmol/L 100 101 94(L)  CO2 20 - 29 mmol/L 24 21 26   Calcium 8.6 - 10.2 mg/dL 10.4(H) 10.1 10.3(H)  Total Protein 6.0 - 8.5 g/dL - 8.6(H) -  Total Bilirubin 0.0 - 1.2 mg/dL - 0.4 -  Alkaline Phos 44 - 121 IU/L - 105 -  AST 0 - 40 IU/L - 85(H) -  ALT 0 - 44 IU/L -  72(H) -    Lipid Panel     Component Value Date/Time   CHOL 204 (H) 02/07/2021 1109   TRIG 126 02/07/2021 1109   HDL 81 02/07/2021 1109   CHOLHDL 2.5 02/07/2021 1109   LDLCALC 101 (H) 02/07/2021 1109    CBC    Component Value Date/Time   WBC 3.5 (L) 01/30/2020 1934   RBC 4.53 01/30/2020 1934   HGB 14.3 01/30/2020 1934   HCT 41.4 01/30/2020 1934   PLT 181 01/30/2020 1934   MCV 91.4 01/30/2020 1934   MCH 31.6 01/30/2020 1934   MCHC 34.5 01/30/2020 1934   RDW 14.0 01/30/2020 1934    No results found for: HGBA1C  Assessment & Plan:  1. Essential hypertension Uncontrolled Blood pressure is elevated but was previously normal Continue with amlodipine, carvedilol, losartan/HCTZ We will have him follow-up with the clinical pharmacist to reassess and consider adjusting antihypertensive regimen if indicated Tobacco use could also be contributory  2. Tobacco abuse Smoking cessation support: smoking cessation hotline: 1-800-QUIT-NOW.  Smoking cessation classes are available through [facility]. Call [phone] or visit our website at [facility].  Spent 3 minutes counseling on dangers of tobacco use and benefits of quitting, offered pharmacological intervention to aid quitting and patient is ready to quit. Placed on Nicotine patches  3. Screening for colon cancer - Fecal occult blood, imunochemical([facility]/Sunquest)   Health Care Maintenance: He declines vaccinations today Meds ordered this encounter  Medications   nicotine (NICODERM CQ) 7 mg/24hr patch    Sig: Place 1 patch (7 mg total) onto the skin daily.    Dispense:  28 patch    Refill:  1    Follow-up: Return in about 2 weeks (around 07/11/2021) for Luke Bp check; PCP 6 months Medical conditions.       [email or web address], MD, FAAFP. [facility] [place], [facility] [place]   06/27/2021, 10:30 AM

## 2021-06-27 NOTE — Patient Instructions (Signed)

## 2021-07-04 ENCOUNTER — Other Ambulatory Visit: Payer: Self-pay

## 2021-07-25 ENCOUNTER — Other Ambulatory Visit: Payer: Self-pay

## 2021-07-25 ENCOUNTER — Encounter: Payer: Self-pay | Admitting: Pharmacist

## 2021-07-25 ENCOUNTER — Ambulatory Visit: Payer: Self-pay | Attending: Family Medicine | Admitting: Pharmacist

## 2021-07-25 VITALS — BP 166/83

## 2021-07-25 DIAGNOSIS — I10 Essential (primary) hypertension: Secondary | ICD-10-CM

## 2021-07-25 MED FILL — Amlodipine Besylate Tab 10 MG (Base Equivalent): ORAL | 30 days supply | Qty: 30 | Fill #2 | Status: AC

## 2021-07-25 MED FILL — Carvedilol Tab 6.25 MG: ORAL | 30 days supply | Qty: 60 | Fill #2 | Status: AC

## 2021-07-25 MED FILL — Losartan Potassium & Hydrochlorothiazide Tab 100-25 MG: ORAL | 30 days supply | Qty: 30 | Fill #2 | Status: AC

## 2021-07-25 NOTE — Progress Notes (Signed)
   S:     PCP: Dr. Alvis Lemmings  Patient arrives in good spirits. Presents to the clinic for hypertension evaluation, counseling, and management. Patient was referred and last seen by Primary Care Provider on 06/27/21. Pt last seen by pharmacy on 04/14/2021.  At last PCP visit, BP was above goal at 153/75 which was concerning because patient's BP was 104/66 in May 2022. Today, patient endorses he has taken all of his medications besides losartan-HCTZ (Hyzaar), which he has been out of for 2 days as he is unable to afford a refill. Patient picked up refills (other than losartan-HCTZ) from the [facility] pharmacy today. Denies smoking a cigarette or drinking caffeine today. Denies dizziness, headaches, blurry vision, and LE swelling.  Medication adherence reported to amlodipine (Norvasc) 10 mg daily and carvedilol (Coreg) 6.25 mg BID. Unable to afford losartan-HCTZ refill  Current BP Medications include: losartan/hctz 100/25 mg daily, amlodipine 10 mg daily, carvedilol 6.25 mg BID  Antihypertensives tried in the past include: none   Dietary habits and exercise: Not addressed at this visit Family History: none Tobacco: 2 cigarettes a day Alcohol: drinks a 40 oz beer a few times a week   O:  Vitals:   07/25/21 0940  BP: (!) 166/83   Home BP readings: not checking  Last 3 Office BP readings: BP Readings from Last 3 Encounters:  07/25/21 (!) 166/83  06/27/21 (!) 153/75  04/14/21 104/66   BMET    Component Value Date/Time   NA 142 03/15/2021 1025   K 3.7 03/15/2021 1025   CL 100 03/15/2021 1025   CO2 24 03/15/2021 1025   GLUCOSE 82 03/15/2021 1025   GLUCOSE 87 01/30/2020 1934   BUN 11 03/15/2021 1025   CREATININE 0.98 03/15/2021 1025   CALCIUM 10.4 (H) 03/15/2021 1025   GFRNONAA 94 05/03/2020 1510   GFRAA 109 05/03/2020 1510    Renal function: CrCl cannot be calculated (Patient's most recent lab result is older than the maximum 21 days allowed.).  Clinical ASCVD: No  The 10-year ASCVD  risk score Denman George DC Jr., et al., 2013) is: 32%   Values used to calculate the score:     Age: 60 years     Sex: Male     Is Non-Hispanic African American: Yes     Diabetic: No     Tobacco smoker: Yes     Systolic Blood Pressure: 166 mmHg     Is BP treated: Yes     HDL Cholesterol: 81 mg/dL     Total Cholesterol: 204 mg/dL   A/P: Hypertension longstanding currently above goal on current medications. BP Goal = <130/80 mmHg. Given patients inability to take losartan-HCTZ (Hyzaar) for a few days, recommend patient utilize the pharmacy charge system so he is able to take losartan-HCTZ (Hyzaar) as prescribed prior to his next appointment. -No medication changes today -Continue amlodipine (Norvasc) 10 mg daily -Continued losartan-HCTZ (Hyzaar) 100/25 mg daily -Continued carvedilol (Coreg) 6.25 mg BID  Results reviewed and written information provided. Total time in face-to-face counseling 25 minutes.   F/U Pharmacy Clinic Visit in 2 weeks.    Valeda Malm, Pharm.D. PGY-1 Ambulatory Care Resident 07/26/2021 11:56 AM

## 2021-08-09 ENCOUNTER — Other Ambulatory Visit: Payer: Self-pay

## 2021-08-09 ENCOUNTER — Ambulatory Visit: Payer: Self-pay | Attending: Family Medicine | Admitting: Pharmacist

## 2021-08-09 VITALS — BP 151/83 | HR 77

## 2021-08-09 DIAGNOSIS — I10 Essential (primary) hypertension: Secondary | ICD-10-CM

## 2021-08-09 MED ORDER — CARVEDILOL 12.5 MG PO TABS
12.5000 mg | ORAL_TABLET | Freq: Two times a day (BID) | ORAL | 2 refills | Status: DC
  Filled 2021-08-09 – 2021-09-08 (×2): qty 60, 30d supply, fill #0
  Filled 2021-10-24: qty 60, 30d supply, fill #1
  Filled 2021-12-27: qty 60, 30d supply, fill #0
  Filled 2021-12-27: qty 60, 30d supply, fill #2

## 2021-08-09 NOTE — Progress Notes (Signed)
   S:     PCP: Dr. Alvis Lemmings  Patient arrives in good spirits. Presents to the clinic for hypertension evaluation, counseling, and management. Patient was referred and last seen by Primary Care Provider on 06/27/21. Pt last seen by pharmacy on 07/25/2021. At his last pharmacy visit, his BP was elevated at 166/83 mmHg which is likely due to him not taking losartan-HCTZ for a few days due to cost.   Today, patient reports he was able to pick up his rx right after his appointment and has not missed any doses since his visit. Denies dizziness, headaches, blurry vision, and LE swelling.  Medication adherence reported.   Current BP Medications include: losartan/hctz 100/25 mg daily, amlodipine 10 mg daily, carvedilol 6.25 mg BID  Antihypertensives tried in the past include: none   Dietary habits and exercise: Not addressed at this visit Family History: none Tobacco: 2 cigarettes a day Alcohol: drinks a 40 oz beer a few times a week  O:  Vitals:   08/09/21 0953  BP: (!) 151/83  Pulse: 77    Home BP readings: not checking  Last 3 Office BP readings: BP Readings from Last 3 Encounters:  08/09/21 (!) 151/83  07/25/21 (!) 166/83  06/27/21 (!) 153/75   BMET    Component Value Date/Time   NA 142 03/15/2021 1025   K 3.7 03/15/2021 1025   CL 100 03/15/2021 1025   CO2 24 03/15/2021 1025   GLUCOSE 82 03/15/2021 1025   GLUCOSE 87 01/30/2020 1934   BUN 11 03/15/2021 1025   CREATININE 0.98 03/15/2021 1025   CALCIUM 10.4 (H) 03/15/2021 1025   GFRNONAA 94 05/03/2020 1510   GFRAA 109 05/03/2020 1510   Renal function: CrCl cannot be calculated (Patient's most recent lab result is older than the maximum 21 days allowed.).  Clinical ASCVD: No  The 10-year ASCVD risk score (Arnett DK, et al., 2019) is: 27.5%   Values used to calculate the score:     Age: 60 years     Sex: Male     Is Non-Hispanic African American: Yes     Diabetic: No     Tobacco smoker: Yes     Systolic Blood Pressure:  151 mmHg     Is BP treated: Yes     HDL Cholesterol: 81 mg/dL     Total Cholesterol: 204 mg/dL  A/P: Hypertension longstanding, currently above goal on current medications. BP Goal = <130/80 mmHg. Cost has previously been a social determinant of health and to avoid increasing financial burden, recommend optimizing current regimen instead of introducing a new agent at this time. Patient's ASCVD score is 27.5% after adjusting for today's BP in clinic. Patient is amenable to starting a statin.  -Increased carvedilol to (Coreg) 12.5 mg BID -Continue amlodipine (Norvasc) 10 mg daily -Continued losartan-HCTZ (Hyzaar) 100/25 mg daily -ASCVD risk score >20%, consider initiating a moderate intensity statin, such as atorvasatin 20 mg daily, at next visit. -Counseled on lifestyle modifications for blood pressure control including reduced dietary sodium, increased exercise, adequate sleep.  Health maintenance -No A1c on file  -Recommend checking A1c at next visit based on ADA guidelines recommendations of screening all patients >35 YO for prediabetes and diabetes at least every 3 years  Results reviewed and written information provided. Total time in face-to-face counseling 15 minutes.   F/U Pharmacy Clinic Visit in 1 month.  Valeda Malm, Pharm.D. PGY-1 Ambulatory Care Resident 08/09/2021 10:07 AM

## 2021-08-16 ENCOUNTER — Other Ambulatory Visit: Payer: Self-pay

## 2021-09-07 ENCOUNTER — Other Ambulatory Visit: Payer: Self-pay

## 2021-09-07 MED FILL — Losartan Potassium & Hydrochlorothiazide Tab 100-25 MG: ORAL | 30 days supply | Qty: 30 | Fill #3 | Status: AC

## 2021-09-07 MED FILL — Amlodipine Besylate Tab 10 MG (Base Equivalent): ORAL | 30 days supply | Qty: 30 | Fill #3 | Status: AC

## 2021-09-08 ENCOUNTER — Other Ambulatory Visit: Payer: Self-pay

## 2021-09-08 ENCOUNTER — Ambulatory Visit: Payer: Self-pay | Attending: Family Medicine | Admitting: Pharmacist

## 2021-09-08 VITALS — BP 155/81 | HR 82

## 2021-09-08 DIAGNOSIS — I10 Essential (primary) hypertension: Secondary | ICD-10-CM

## 2021-09-08 NOTE — Progress Notes (Signed)
   S:     PCP: Dr. Alvis Lemmings  Patient arrives in good spirits. Presents to the clinic for hypertension evaluation, counseling, and management. Patient was referred and last seen by Primary Care Provider on 06/27/21. Pt last seen by pharmacy on 08/09/2021. At that visit, we increased carvedilol to 12.5 mg BID.   Today, patient tells [place] that he did not pick-up carvedilol but has remained compliant with the Hyzaar and amlodipine. Denies dizziness, headaches, blurry vision, and LE swelling.  Medication adherence reported.   Current BP Medications include: losartan/hctz 100/25 mg daily, amlodipine 10 mg daily, carvedilol 12.5 mg BID (never picked this up)  Antihypertensives tried in the past include: none   Dietary habits and exercise: Not addressed at this visit Family History: none Tobacco: 2 cigarettes a day Alcohol: drinks a 40 oz beer a few times a week  O:  Vitals:   09/08/21 1048  BP: (!) 155/81  Pulse: 82   Home BP readings: not checking  Last 3 Office BP readings: BP Readings from Last 3 Encounters:  09/08/21 (!) 155/81  08/09/21 (!) 151/83  07/25/21 (!) 166/83   BMET    Component Value Date/Time   NA 142 03/15/2021 1025   K 3.7 03/15/2021 1025   CL 100 03/15/2021 1025   CO2 24 03/15/2021 1025   GLUCOSE 82 03/15/2021 1025   GLUCOSE 87 01/30/2020 1934   BUN 11 03/15/2021 1025   CREATININE 0.98 03/15/2021 1025   CALCIUM 10.4 (H) 03/15/2021 1025   GFRNONAA 94 05/03/2020 1510   GFRAA 109 05/03/2020 1510   Renal function: CrCl cannot be calculated (Patient's most recent lab result is older than the maximum 21 days allowed.).  Clinical ASCVD: No  The 10-year ASCVD risk score (Arnett DK, et al., 2019) is: 28.7%   Values used to calculate the score:     Age: 60 years     Sex: Male     Is Non-Hispanic African American: Yes     Diabetic: No     Tobacco smoker: Yes     Systolic Blood Pressure: 155 mmHg     Is BP treated: Yes     HDL Cholesterol: 81 mg/dL     Total  Cholesterol: 204 mg/dL  A/P: Hypertension longstanding, currently above goal on current medications. BP Goal = <130/80 mmHg. Will have him pick-up the carvedilol and start this today.  -Continue carvedilol (Coreg) 12.5 mg BID -Continue amlodipine (Norvasc) 10 mg daily -Continued losartan-HCTZ (Hyzaar) 100/25 mg daily -Counseled on lifestyle modifications for blood pressure control including reduced dietary sodium, increased exercise, adequate sleep.  Results reviewed and written information provided. Total time in face-to-face counseling 15 minutes.   F/U Pharmacy Clinic Visit in 1 month.  Butch Penny, PharmD, [place], CPP Clinical Pharmacist [facility] & [facility] [phone]

## 2021-10-09 ENCOUNTER — Ambulatory Visit: Payer: Self-pay | Admitting: Pharmacist

## 2021-10-24 ENCOUNTER — Other Ambulatory Visit: Payer: Self-pay

## 2021-10-24 MED FILL — Losartan Potassium & Hydrochlorothiazide Tab 100-25 MG: ORAL | 30 days supply | Qty: 30 | Fill #4 | Status: AC

## 2021-10-24 MED FILL — Amlodipine Besylate Tab 10 MG (Base Equivalent): ORAL | 30 days supply | Qty: 30 | Fill #4 | Status: AC

## 2021-10-25 ENCOUNTER — Other Ambulatory Visit: Payer: Self-pay

## 2021-10-25 ENCOUNTER — Encounter: Payer: Self-pay | Admitting: Pharmacist

## 2021-10-25 ENCOUNTER — Ambulatory Visit: Payer: Self-pay | Attending: Family Medicine | Admitting: Pharmacist

## 2021-10-25 VITALS — BP 120/70

## 2021-10-25 DIAGNOSIS — I10 Essential (primary) hypertension: Secondary | ICD-10-CM

## 2021-10-25 NOTE — Progress Notes (Signed)
   S:     PCP: Dr. Alvis Lemmings  Patient arrives in good spirits. Presents to the clinic for hypertension evaluation, counseling, and management. Patient was referred and last seen by Primary Care Provider on 06/27/21. Pt last seen by pharmacy on 09/08/2021.   Today, patient tells [place] that he is compliant with the Hyzaar, amlodipine, and carvedilol. Denies dizziness, headaches, blurry vision, and LE swelling.  Medication adherence reported.   Current BP Medications include: losartan/hctz 100/25 mg daily, amlodipine 10 mg daily, carvedilol 12.5 mg BID  Antihypertensives tried in the past include: none   Dietary habits and exercise: Not addressed at this visit Family History: none Tobacco: 2 cigarettes a day Alcohol: drinks a 40 oz beer a few times a week  O:  Vitals:   10/25/21 1138  BP: 120/70   Home BP readings: not checking  Last 3 Office BP readings: BP Readings from Last 3 Encounters:  10/25/21 120/70  09/08/21 (!) 155/81  08/09/21 (!) 151/83   BMET    Component Value Date/Time   NA 142 03/15/2021 1025   K 3.7 03/15/2021 1025   CL 100 03/15/2021 1025   CO2 24 03/15/2021 1025   GLUCOSE 82 03/15/2021 1025   GLUCOSE 87 01/30/2020 1934   BUN 11 03/15/2021 1025   CREATININE 0.98 03/15/2021 1025   CALCIUM 10.4 (H) 03/15/2021 1025   GFRNONAA 94 05/03/2020 1510   GFRAA 109 05/03/2020 1510   Renal function: CrCl cannot be calculated (Patient's most recent lab result is older than the maximum 21 days allowed.).  Clinical ASCVD: No  The 10-year ASCVD risk score (Arnett DK, et al., 2019) is: 18.7%   Values used to calculate the score:     Age: 60 years     Sex: Male     Is Non-Hispanic African American: Yes     Diabetic: No     Tobacco smoker: Yes     Systolic Blood Pressure: 120 mmHg     Is BP treated: Yes     HDL Cholesterol: 81 mg/dL     Total Cholesterol: 204 mg/dL  A/P: Hypertension longstanding, currently at goal on current medications. BP Goal = <130/80 mmHg. No  medication changes. -Continue carvedilol (Coreg) 12.5 mg BID -Continue amlodipine (Norvasc) 10 mg daily -Continued losartan-HCTZ (Hyzaar) 100/25 mg daily -Counseled on lifestyle modifications for blood pressure control including reduced dietary sodium, increased exercise, adequate sleep.  Results reviewed and written information provided. Total time in face-to-face counseling 15 minutes.   F/U PCP Clinic Visit in Feb.  Butch Penny, PharmD, [place], CPP Clinical Pharmacist [facility] [phone]

## 2021-12-27 ENCOUNTER — Other Ambulatory Visit: Payer: Self-pay

## 2021-12-27 MED FILL — Losartan Potassium & Hydrochlorothiazide Tab 100-25 MG: ORAL | 30 days supply | Qty: 30 | Fill #0 | Status: AC

## 2021-12-27 MED FILL — Amlodipine Besylate Tab 10 MG (Base Equivalent): ORAL | 30 days supply | Qty: 30 | Fill #0 | Status: AC

## 2021-12-27 MED FILL — Amlodipine Besylate Tab 10 MG (Base Equivalent): ORAL | 30 days supply | Qty: 30 | Fill #5 | Status: CN

## 2021-12-27 MED FILL — Losartan Potassium & Hydrochlorothiazide Tab 100-25 MG: ORAL | 30 days supply | Qty: 30 | Fill #5 | Status: CN

## 2021-12-28 ENCOUNTER — Ambulatory Visit: Payer: Self-pay | Attending: Family Medicine | Admitting: Family Medicine

## 2021-12-28 ENCOUNTER — Encounter: Payer: Self-pay | Admitting: Family Medicine

## 2021-12-28 ENCOUNTER — Other Ambulatory Visit: Payer: Self-pay

## 2021-12-28 VITALS — BP 159/88 | HR 79 | Ht 65.0 in | Wt 118.6 lb

## 2021-12-28 DIAGNOSIS — Z1211 Encounter for screening for malignant neoplasm of colon: Secondary | ICD-10-CM

## 2021-12-28 DIAGNOSIS — F17210 Nicotine dependence, cigarettes, uncomplicated: Secondary | ICD-10-CM

## 2021-12-28 DIAGNOSIS — I10 Essential (primary) hypertension: Secondary | ICD-10-CM

## 2021-12-28 DIAGNOSIS — Z1159 Encounter for screening for other viral diseases: Secondary | ICD-10-CM

## 2021-12-28 MED ORDER — AMLODIPINE BESYLATE 10 MG PO TABS
ORAL_TABLET | Freq: Every day | ORAL | 6 refills | Status: DC
  Filled 2021-12-28: qty 30, fill #0
  Filled 2022-02-05 (×2): qty 30, 30d supply, fill #0
  Filled 2022-03-15: qty 30, 30d supply, fill #1
  Filled 2022-05-25: qty 30, 30d supply, fill #2
  Filled 2022-09-24: qty 30, 30d supply, fill #3

## 2021-12-28 MED ORDER — LOSARTAN POTASSIUM-HCTZ 100-25 MG PO TABS
1.0000 | ORAL_TABLET | Freq: Every day | ORAL | 6 refills | Status: DC
  Filled 2021-12-28 – 2022-02-05 (×2): qty 30, 30d supply, fill #0
  Filled 2022-03-15: qty 30, 30d supply, fill #1
  Filled 2022-04-18 – 2022-05-25 (×2): qty 30, 30d supply, fill #2
  Filled 2022-09-24: qty 30, 30d supply, fill #3

## 2021-12-28 MED ORDER — CARVEDILOL 12.5 MG PO TABS
12.5000 mg | ORAL_TABLET | Freq: Two times a day (BID) | ORAL | 6 refills | Status: DC
  Filled 2021-12-28 – 2022-02-05 (×2): qty 60, 30d supply, fill #0
  Filled 2022-03-15: qty 60, 30d supply, fill #1
  Filled 2022-04-18 – 2022-05-25 (×2): qty 60, 30d supply, fill #2
  Filled 2022-09-24: qty 60, 30d supply, fill #3

## 2021-12-28 NOTE — Progress Notes (Signed)
Subjective:  Patient ID: Christian Matthews, male    DOB: 05-10-1961  Age: 61 y.o. MRN: [id number]  CC: Hypertension   HPI Christian Matthews is a 61 y.o. year old male with a history of hypertension, tobacco abuse who presents today for chronic disease management.  Interval History: He has been smoking since he was a teenager and smoked about a pack a day till about a few months ago when she reduced to 5 to 6 cigarettes/day and states he is not ready to quit.  His blood pressure is elevated at 159/88 but surprisingly was 120/70 at the clinical pharmacy visit 2 months ago.  He endorses taking his antihypertensives today and denies smoking a cigarette this morning or drinking coffee. He has no chest pain or dyspnea and denies additional concerns. Past Medical History:  Diagnosis Date   Essential hypertension 03/30/2020    No past surgical history on file.  No family history on file.  No Known Allergies  Outpatient Medications Prior to Visit  Medication Sig Dispense Refill   Multiple Vitamin (MULTIVITAMIN WITH MINERALS) TABS tablet Take 1 tablet by mouth daily. 30 tablet 0   thiamine 100 MG tablet Take 1 tablet (100 mg total) by mouth daily. 30 tablet 0   amLODipine (NORVASC) 10 MG tablet TAKE 1 TABLET (10 MG TOTAL) BY MOUTH DAILY. 30 tablet 6   carvedilol (COREG) 12.5 MG tablet Take 1 tablet (12.5 mg total) by mouth 2 (two) times daily with a meal. 60 tablet 2   losartan-hydrochlorothiazide (HYZAAR) 100-25 MG tablet TAKE 1 TABLET BY MOUTH DAILY. 30 tablet 6   nicotine (NICODERM CQ) 7 mg/24hr patch Place 1 patch (7 mg total) onto the skin daily. (Patient not taking: Reported on 12/28/2021) 28 patch 1   No facility-administered medications prior to visit.     ROS Review of Systems  Constitutional:  Negative for activity change and appetite change.  HENT:  Negative for sinus pressure and sore throat.   Eyes:  Negative for visual disturbance.  Respiratory:  Negative for cough, chest tightness  and shortness of breath.   Cardiovascular:  Negative for chest pain and leg swelling.  Gastrointestinal:  Negative for abdominal distention, abdominal pain, constipation and diarrhea.  Endocrine: Negative.   Genitourinary:  Negative for dysuria.  Musculoskeletal:  Negative for joint swelling and myalgias.  Skin:  Negative for rash.  Allergic/Immunologic: Negative.   Neurological:  Negative for weakness, light-headedness and numbness.  Psychiatric/Behavioral:  Negative for dysphoric mood and suicidal ideas.    Objective:  BP (!) 159/88    Pulse 79    Ht 5\' 5"  (1.651 m)    Wt 118 lb 9.6 oz (53.8 kg)    SpO2 100%    BMI 19.74 kg/m   BP/Weight 12/28/2021 10/25/2021 0000000  Systolic BP [id number]  Diastolic BP 88 70 81  Wt. (Lbs) 118.6 - -  BMI 19.74 - -      Physical Exam Constitutional:      Appearance: He is well-developed.  Cardiovascular:     Rate and Rhythm: Normal rate.     Heart sounds: Normal heart sounds. No murmur heard. Pulmonary:     Effort: Pulmonary effort is normal.     Breath sounds: Normal breath sounds. No wheezing or rales.  Chest:     Chest wall: No tenderness.  Abdominal:     General: Bowel sounds are normal. There is no distension.     Palpations: Abdomen is soft. There is no mass.  Tenderness: There is no abdominal tenderness.  Musculoskeletal:        General: Normal range of motion.     Right lower leg: No edema.     Left lower leg: No edema.  Neurological:     Mental Status: He is alert and oriented to person, place, and time.  Psychiatric:        Mood and Affect: Mood normal.    CMP Latest Ref Rng & Units 03/15/2021 02/07/2021 05/03/2020  Glucose 65 - 99 mg/dL 82 85 101(H)  BUN 8 - 27 mg/dL 11 9 12   Creatinine 0.76 - 1.27 mg/dL 0.98 0.84 0.85  Sodium 134 - 144 mmol/L 142 142 136  Potassium 3.5 - 5.2 mmol/L 3.7 4.2 3.7  Chloride 96 - 106 mmol/L 100 101 94(L)  CO2 20 - 29 mmol/L 24 21 26   Calcium 8.6 - 10.2 mg/dL 10.4(H) 10.1 10.3(H)   Total Protein 6.0 - 8.5 g/dL - 8.6(H) -  Total Bilirubin 0.0 - 1.2 mg/dL - 0.4 -  Alkaline Phos 44 - 121 IU/L - 105 -  AST 0 - 40 IU/L - 85(H) -  ALT 0 - 44 IU/L - 72(H) -    Lipid Panel     Component Value Date/Time   CHOL 204 (H) 02/07/2021 1109   TRIG 126 02/07/2021 1109   HDL 81 02/07/2021 1109   CHOLHDL 2.5 02/07/2021 1109   LDLCALC 101 (H) 02/07/2021 1109    CBC    Component Value Date/Time   WBC 3.5 (L) 01/30/2020 1934   RBC 4.53 01/30/2020 1934   HGB 14.3 01/30/2020 1934   HCT 41.4 01/30/2020 1934   PLT 181 01/30/2020 1934   MCV 91.4 01/30/2020 1934   MCH 31.6 01/30/2020 1934   MCHC 34.5 01/30/2020 1934   RDW 14.0 01/30/2020 1934    No results found for: HGBA1C  Assessment & Plan:  1. Essential hypertension Controlled Blood pressure was surprisingly controlled at 120/70 on the same regimen at his visit with the clinical pharmacist 2 months ago. I will not make changes today and have him follow-up again with the clinical pharmacist to reassess this and if elevated adjust his regimen accordingly Counseled on blood pressure goal of less than 130/80, low-sodium, DASH diet, medication compliance, 150 minutes of moderate intensity exercise per week. Discussed medication compliance, adverse effects. - amLODipine (NORVASC) 10 MG tablet; TAKE 1 TABLET (10 MG TOTAL) BY MOUTH DAILY.  Dispense: 30 tablet; Refill: 6 - losartan-hydrochlorothiazide (HYZAAR) 100-25 MG tablet; TAKE 1 TABLET BY MOUTH DAILY.  Dispense: 30 tablet; Refill: 6 - carvedilol (COREG) 12.5 MG tablet; Take 1 tablet (12.5 mg total) by mouth 2 (two) times daily with a meal.  Dispense: 60 tablet; Refill: 6  2. Screening for viral disease - HIV Antibody (routine testing w rflx)  3. Screening for colon cancer Stool FIT test sent off  4. Smoking greater than 20 pack years Spent 3 minutes counseling on smoking cessation hazardous effects of smoking but he is not ready to quit at this time but willing to  work on cutting back.  He declines pharmacotherapy to assist in cessation. - CT CHEST LUNG CA SCREEN LOW DOSE W/O CM; Future    Meds ordered this encounter  Medications   amLODipine (NORVASC) 10 MG tablet    Sig: TAKE 1 TABLET (10 MG TOTAL) BY MOUTH DAILY.    Dispense:  30 tablet    Refill:  6   losartan-hydrochlorothiazide (HYZAAR) 100-25 MG tablet  Sig: TAKE 1 TABLET BY MOUTH DAILY.    Dispense:  30 tablet    Refill:  6   carvedilol (COREG) 12.5 MG tablet    Sig: Take 1 tablet (12.5 mg total) by mouth 2 (two) times daily with a meal.    Dispense:  60 tablet    Refill:  6    Follow-up: Return in about 2 weeks (around 01/11/2022) for Blood pressure follow-up with Lurena Joiner; 3 months with PCP.       Charlott Rakes, MD, FAAFP. [facility], [place]   12/28/2021, 9:07 AM

## 2021-12-28 NOTE — Patient Instructions (Signed)
Steps to Quit Smoking °Smoking tobacco is the leading cause of preventable death. It can affect almost every organ in the body. Smoking puts you and those around you at risk for developing many serious chronic diseases. Quitting smoking can be difficult, but it is one of the best things that you can do for your health. It is never too late to quit. °How do I get ready to quit? °When you decide to quit smoking, create a plan to help you succeed. Before you quit: °Pick a date to quit. Set a date within the next 2 weeks to give you time to prepare. °Write down the reasons why you are quitting. Keep this list in places where you will see it often. °Tell your family, friends, and co-workers that you are quitting. Support from your loved ones can make quitting easier. °Talk with your health care provider about your options for quitting smoking. °Find out what treatment options are covered by your health insurance. °Identify people, places, things, and activities that make you want to smoke (triggers). Avoid them. °What first steps can I take to quit smoking? °Throw away all cigarettes at home, at work, and in your car. °Throw away smoking accessories, such as ashtrays and lighters. °Clean your car. Make sure to empty the ashtray. °Clean your home, including curtains and carpets. °What strategies can I use to quit smoking? °Talk with your health care provider about combining strategies, such as taking medicines while you are also receiving in-person counseling. Using these two strategies together makes you more likely to succeed in quitting than if you used either strategy on its own. °If you are pregnant or breastfeeding, talk with your health care provider about finding counseling or other support strategies to quit smoking. Do not take medicine to help you quit smoking unless your health care provider tells you to do so. °To quit smoking: °Quit right away °Quit smoking completely, instead of gradually reducing how much  you smoke over a period of time. Research shows that stopping smoking right away is more successful than gradually quitting. °Attend in-person counseling to help you build problem-solving skills. You are more likely to succeed in quitting if you attend counseling sessions regularly. Even short sessions of 10 minutes can be effective. °Take medicine °You may take medicines to help you quit smoking. Some medicines require a prescription and some you can purchase over-the-counter. Medicines may have nicotine in them to replace the nicotine in cigarettes. Medicines may: °Help to stop cravings. °Help to relieve withdrawal symptoms. °Your health care provider may recommend: °Nicotine patches, gum, or lozenges. °Nicotine inhalers or sprays. °Non-nicotine medicine that is taken by mouth. °Find resources °Find resources and support systems that can help you to quit smoking and remain smoke-free after you quit. These resources are most helpful when you use them often. They include: °Online chats with a counselor. °Telephone quitlines. °Printed self-help materials. °Support groups or group counseling. °Text messaging programs. °Mobile phone apps or applications. Use apps that can help you stick to your quit plan by providing reminders, tips, and encouragement. There are many free apps for mobile devices as well as websites. Examples include Quit Guide from the CDC and smokefree.gov °What things can I do to make it easier to quit? ° °Reach out to your family and friends for support and encouragement. Call telephone quitlines (1-800-QUIT-NOW), reach out to support groups, or work with a counselor for support. °Ask people who smoke to avoid smoking around you. °Avoid places that trigger you   to smoke, such as bars, parties, or smoke-break areas at work. °Spend time with people who do not smoke. °Lessen the stress in your life. Stress can be a smoking trigger for some people. To lessen stress, try: °Exercising regularly. °Doing  deep-breathing exercises. °Doing yoga. °Meditating. °Performing a body scan. This involves closing your eyes, scanning your body from head to toe, and noticing which parts of your body are particularly tense. Try to relax the muscles in those areas. °How will I feel when I quit smoking? °Day 1 to 3 weeks °Within the first 24 hours of quitting smoking, you may start to feel withdrawal symptoms. These symptoms are usually most noticeable 2-3 days after quitting, but they usually do not last for more than 2-3 weeks. You may experience these symptoms: °Mood swings. °Restlessness, anxiety, or irritability. °Trouble concentrating. °Dizziness. °Strong cravings for sugary foods and nicotine. °Mild weight gain. °Constipation. °Nausea. °Coughing or a sore throat. °Changes in how the medicines that you take for unrelated issues work in your body. °Depression. °Trouble sleeping (insomnia). °Week 3 and afterward °After the first 2-3 weeks of quitting, you may start to notice more positive results, such as: °Improved sense of smell and taste. °Decreased coughing and sore throat. °Slower heart rate. °Lower blood pressure. °Clearer skin. °The ability to breathe more easily. °Fewer sick days. °Quitting smoking can be very challenging. Do not get discouraged if you are not successful the first time. Some people need to make many attempts to quit before they achieve long-term success. Do your best to stick to your quit plan, and talk with your health care provider if you have any questions or concerns. °Summary °Smoking tobacco is the leading cause of preventable death. Quitting smoking is one of the best things that you can do for your health. °When you decide to quit smoking, create a plan to help you succeed. °Quit smoking right away, not slowly over a period of time. °When you start quitting, seek help from your health care provider, family, or friends. °This information is not intended to replace advice given to you by your  health care provider. Make sure you discuss any questions you have with your health care provider. °Document Revised: 07/14/2021 Document Reviewed: 01/24/2019 °Elsevier Patient Education © 2022 [employer] Inc. ° °

## 2021-12-29 ENCOUNTER — Telehealth: Payer: Self-pay

## 2021-12-29 LAB — HIV ANTIBODY (ROUTINE TESTING W REFLEX): HIV Screen 4th Generation wRfx: NONREACTIVE

## 2021-12-29 NOTE — Telephone Encounter (Signed)
Patient name and DOB has been verified Patient was informed of lab results. Patient had no questions.  

## 2021-12-29 NOTE — Telephone Encounter (Signed)
-----   Message from Hoy Register, MD sent at 12/29/2021 11:57 AM EST ----- HIV test is negative

## 2022-01-04 ENCOUNTER — Other Ambulatory Visit: Payer: Self-pay

## 2022-01-04 ENCOUNTER — Telehealth: Payer: Self-pay

## 2022-01-04 ENCOUNTER — Ambulatory Visit (HOSPITAL_COMMUNITY)
Admission: RE | Admit: 2022-01-04 | Discharge: 2022-01-04 | Disposition: A | Discharge status: Home / Self Care | Payer: 59 | Source: Ambulatory Visit | Attending: Family Medicine | Admitting: Family Medicine

## 2022-01-04 DIAGNOSIS — F17210 Nicotine dependence, cigarettes, uncomplicated: Secondary | ICD-10-CM | POA: Insufficient documentation

## 2022-01-04 NOTE — Telephone Encounter (Signed)
PA paperwork has been submitted to [facility]

## 2022-01-08 ENCOUNTER — Other Ambulatory Visit: Payer: Self-pay | Admitting: Family Medicine

## 2022-01-08 ENCOUNTER — Encounter: Payer: Self-pay | Admitting: Family Medicine

## 2022-01-08 ENCOUNTER — Other Ambulatory Visit: Payer: Self-pay

## 2022-01-08 DIAGNOSIS — I700 Atherosclerosis of aorta: Secondary | ICD-10-CM | POA: Insufficient documentation

## 2022-01-08 MED ORDER — ATORVASTATIN CALCIUM 20 MG PO TABS
20.0000 mg | ORAL_TABLET | Freq: Every day | ORAL | 1 refills | Status: DC
  Filled 2022-01-08: qty 30, 30d supply, fill #0

## 2022-01-09 ENCOUNTER — Telehealth: Payer: Self-pay

## 2022-01-09 NOTE — Telephone Encounter (Signed)
Patient was called and a voicemail was left informing patient to return phone call for lab results.   CRM created. 

## 2022-01-09 NOTE — Telephone Encounter (Signed)
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  Compliance with a low-cholesterol diet should be beneficial.

## 2022-01-11 ENCOUNTER — Ambulatory Visit: Payer: Self-pay | Admitting: Pharmacist

## 2022-01-12 NOTE — Telephone Encounter (Signed)
Contacted pt and went over CT results pt doesn't have any questions or concerns

## 2022-01-15 ENCOUNTER — Other Ambulatory Visit: Payer: Self-pay

## 2022-02-02 NOTE — Progress Notes (Signed)
? ?  S:    ?Christian Matthews is a 61 y.o. male who presents for hypertension evaluation, education, and management. PMH is significant for HTN, tobacco abuse, HLD. Patient was referred and last seen by Primary Care Provider, Dr. Alvis Lemmings, on 12/28/21. Patient has been previously seen by CPP, last visit 10/25/21 when BP was at goal. At last visit, BP was 159/88.  ? ?Today, he arrives in good spirits and presents without assistance. Denies dizziness, headache, blurred vision, swelling.  ? ?Family/Social history:  ?-Current smoker ? ?Medication adherence reported. Patient states he has taken his BP medications today. He just filled all of his BP medications at the pharmacy.  ? ?Current antihypertensives include: losartan/HCTZ 100/25 mg daily, amlodipine 10 mg daily, carvedilol 12.5 mg BID ? ?Antihypertensives tried in the past include: none ? ?Reported home BP readings: has upper arm cuff but hasn't checked recently ? ?Patient reported dietary habits: endorses low salt diet, no coffee, occasional soda  ? ?Patient-reported exercise habits: walks 4-5 hours a day ? ?ASCVD risk factors include: HTN, HLD ? ?O:  ? ?Last 3 Office BP readings: ?BP Readings from Last 3 Encounters:  ?02/05/22 (!) 155/84  ?12/28/21 (!) 159/88  ?10/25/21 120/70  ? ? ?BMET ?   ?Component Value Date/Time  ? NA 142 03/15/2021 1025  ? K 3.7 03/15/2021 1025  ? CL 100 03/15/2021 1025  ? CO2 24 03/15/2021 1025  ? GLUCOSE 82 03/15/2021 1025  ? GLUCOSE 87 01/30/2020 1934  ? BUN 11 03/15/2021 1025  ? CREATININE 0.98 03/15/2021 1025  ? CALCIUM 10.4 (H) 03/15/2021 1025  ? GFRNONAA 94 05/03/2020 1510  ? GFRAA 109 05/03/2020 1510  ? ?Renal function: ?CrCl cannot be calculated (Patient's most recent lab result is older than the maximum 21 days allowed.). ? ?Clinical ASCVD: No  ?The 10-year ASCVD risk score (Arnett DK, et al., 2019) is: 29.7% ?  Values used to calculate the score: ?    Age: 44 years ?    Sex: Male ?    Is Non-Hispanic African American: Yes ?    Diabetic:  No ?    Tobacco smoker: Yes ?    Systolic Blood Pressure: 155 mmHg ?    Is BP treated: Yes ?    HDL Cholesterol: 81 mg/dL ?    Total Cholesterol: 204 mg/dL ? ? ?A/P: ?Hypertension diagnosed currently uncontrolled on current medications. BP goal < 130/80 mmHg. Medication adherence is reported but appears questionable given some confusion over his medication regimen. He just refilled all of his BP medications today. We will continue current medications given suspected non-adherence and recheck in 3 weeks. If still elevated, will consider switching losartan-HCTZ to valsartan-HCTZ.  ?-Continue current medications.  ?-F/u labs ordered - update CMET today (last 02/2021) ?-Counseled on lifestyle modifications for blood pressure control including reduced dietary sodium, increased exercise, adequate sleep. ?-Encouraged patient to check BP at home and bring log of readings to next visit. Counseled on proper use of home BP cuff.  ? ?Results reviewed and written information provided. Patient verbalized understanding of treatment plan. Total time in face-to-face counseling 24 minutes.  ? ?F/u pharmacist clinic visit in 3 weeks. Next PCP visit 04/12/22.  ? ?Pervis Hocking, PharmD ?PGY2 Ambulatory Care Pharmacy Resident ?02/05/2022 4:02 PM ? ?

## 2022-02-05 ENCOUNTER — Ambulatory Visit: Payer: 59 | Attending: Family Medicine | Admitting: Pharmacist

## 2022-02-05 ENCOUNTER — Other Ambulatory Visit: Payer: Self-pay

## 2022-02-05 VITALS — BP 155/84 | HR 70

## 2022-02-05 DIAGNOSIS — I10 Essential (primary) hypertension: Secondary | ICD-10-CM | POA: Diagnosis not present

## 2022-02-06 ENCOUNTER — Encounter: Payer: Self-pay | Admitting: Pharmacist

## 2022-02-06 ENCOUNTER — Other Ambulatory Visit: Payer: Self-pay

## 2022-02-06 LAB — CMP14+EGFR
ALT: 29 IU/L (ref 0–44)
AST: 31 IU/L (ref 0–40)
Albumin/Globulin Ratio: 1.6 (ref 1.2–2.2)
Albumin: 4.7 g/dL (ref 3.8–4.8)
Alkaline Phosphatase: 95 IU/L (ref 44–121)
BUN/Creatinine Ratio: 15 (ref 10–24)
BUN: 15 mg/dL (ref 8–27)
Bilirubin Total: 0.6 mg/dL (ref 0.0–1.2)
CO2: 25 mmol/L (ref 20–29)
Calcium: 10 mg/dL (ref 8.6–10.2)
Chloride: 99 mmol/L (ref 96–106)
Creatinine, Ser: 0.97 mg/dL (ref 0.76–1.27)
Globulin, Total: 3 g/dL (ref 1.5–4.5)
Glucose: 85 mg/dL (ref 70–99)
Potassium: 3.9 mmol/L (ref 3.5–5.2)
Sodium: 138 mmol/L (ref 134–144)
Total Protein: 7.7 g/dL (ref 6.0–8.5)
eGFR: 88 mL/min/{1.73_m2} (ref >59)

## 2022-02-23 NOTE — Progress Notes (Unsigned)
? ?  S:    ?Christian Matthews is a 61 y.o. male who presents for hypertension evaluation, education, and management. PMH is significant for HTN, tobacco abuse, HLD. Patient was referred and last seen by Primary Care Provider, Dr. Alvis Lemmings, on 12/28/21, when BP was 159/88. Patient has been previously seen by CPP, last visit 10/25/21 when BP was at goal. Seen by pharmacist 02/05/22, BP was 155/84 but suspected noncompliance so current medications were continued with emphasis on adherence.  ? ?Today, he arrives in good spirits and presents without assistance. Denies dizziness, headache, blurred vision, swelling.  ?***cmet from last visit was wnl - switch los/hctz to val/hctz? ?Next pcp 5/25 - see [place] one more time ? ?Family/Social history:  ?-Current smoker ? ?Medication adherence reported. Patient states he has taken his BP medications today. He just filled all of his BP medications at the pharmacy.  ? ?Current antihypertensives include: losartan/HCTZ 100/25 mg daily, amlodipine 10 mg daily, carvedilol 12.5 mg BID ? ?Antihypertensives tried in the past include: none ? ?Reported home BP readings: has upper arm cuff but hasn't checked recently ? ?Patient reported dietary habits: endorses low salt diet, no coffee, occasional soda  ? ?Patient-reported exercise habits: walks 4-5 hours a day ? ?ASCVD risk factors include: HTN, HLD ? ?O:  ? ?Last 3 Office BP readings: ?BP Readings from Last 3 Encounters:  ?02/05/22 (!) 155/84  ?12/28/21 (!) 159/88  ?10/25/21 120/70  ? ? ?BMET ?   ?Component Value Date/Time  ? NA 138 02/05/2022 1633  ? K 3.9 02/05/2022 1633  ? CL 99 02/05/2022 1633  ? CO2 25 02/05/2022 1633  ? GLUCOSE 85 02/05/2022 1633  ? GLUCOSE 87 01/30/2020 1934  ? BUN 15 02/05/2022 1633  ? CREATININE 0.97 02/05/2022 1633  ? CALCIUM 10.0 02/05/2022 1633  ? GFRNONAA 94 05/03/2020 1510  ? GFRAA 109 05/03/2020 1510  ? ?Renal function: ?CrCl cannot be calculated (Unknown ideal weight.). ? ?Clinical ASCVD: No  ?The 10-year ASCVD risk score  (Arnett DK, et al., 2019) is: 29.7% ?  Values used to calculate the score: ?    Age: 36 years ?    Sex: Male ?    Is Non-Hispanic African American: Yes ?    Diabetic: No ?    Tobacco smoker: Yes ?    Systolic Blood Pressure: 155 mmHg ?    Is BP treated: Yes ?    HDL Cholesterol: 81 mg/dL ?    Total Cholesterol: 204 mg/dL ? ? ?A/P: ?Hypertension diagnosed currently uncontrolled on current medications. BP goal < 130/80 mmHg. Medication adherence is reported but appears questionable given some confusion over his medication regimen. He just refilled all of his BP medications today. We will continue current medications given suspected non-adherence and recheck in 3 weeks. If still elevated, will consider switching losartan-HCTZ to valsartan-HCTZ.  ?-Continue current medications.  ?-F/u labs ordered - update CMET today (last 02/2021) ?-Counseled on lifestyle modifications for blood pressure control including reduced dietary sodium, increased exercise, adequate sleep. ?-Encouraged patient to check BP at home and bring log of readings to next visit. Counseled on proper use of home BP cuff.  ? ?Results reviewed and written information provided. Patient verbalized understanding of treatment plan. Total time in face-to-face counseling 24 minutes.  ? ?F/u pharmacist clinic visit in 3 weeks. Next PCP visit 04/12/22.  ? ?Pervis Hocking, PharmD ?PGY2 Ambulatory Care Pharmacy Resident ?02/23/2022 12:32 PM ? ?

## 2022-02-26 ENCOUNTER — Telehealth: Payer: Self-pay | Admitting: Family Medicine

## 2022-02-26 ENCOUNTER — Ambulatory Visit: Payer: 59 | Admitting: Pharmacist

## 2022-02-26 NOTE — Telephone Encounter (Signed)
Pt needs a ride to his appointment on April 28 at 10:30 am. ?

## 2022-02-26 NOTE — Telephone Encounter (Signed)
Opal Sidles,  ? ?Unsure who to send this to. Is this something you help with or she we forward it to the front?  ?

## 2022-02-28 ENCOUNTER — Telehealth: Payer: Self-pay

## 2022-02-28 NOTE — Telephone Encounter (Signed)
Opened in error

## 2022-02-28 NOTE — Telephone Encounter (Signed)
Call placed to patient to inquire about a ride to his upcoming appointment.  He said he has no way to get here, he has been calling [facility] for an Benedetto Goad when he needed a ride.  I explained that the [facility] ride program has been stopped and each clinic will need to schedule a taxi ride if needed. He said he has no rides/ anyone to transport him. He is able to walk to the store for groceries but it is too far for him to walk to [facility] or to get a bus.  ?I explained that I can arrange a ride for him as we get closer to his appointment date of 03/16/2022  ?

## 2022-03-15 ENCOUNTER — Other Ambulatory Visit: Payer: Self-pay

## 2022-03-15 ENCOUNTER — Telehealth: Payer: Self-pay

## 2022-03-15 NOTE — Telephone Encounter (Signed)
Call placed to patient and he confirmed his appointment tomorrow at [facility] with Benard Halsted, [place]. He also confirmed his address. ? ?I spoke to [employer] and schedule ride for patient to clinic. Pickup time 1000 @ his home.  The ride back home will be arranged when he completes his appointment.  ?Voucher emailed to [facility] as she requested.   ? ?I called patient and informed him of cab company and pick up time and he said he understood.  ?

## 2022-03-16 ENCOUNTER — Ambulatory Visit: Payer: 59 | Attending: Family Medicine | Admitting: Pharmacist

## 2022-03-16 ENCOUNTER — Encounter: Payer: Self-pay | Admitting: Pharmacist

## 2022-03-16 ENCOUNTER — Other Ambulatory Visit: Payer: Self-pay

## 2022-03-16 VITALS — BP 129/78 | HR 69

## 2022-03-16 DIAGNOSIS — I10 Essential (primary) hypertension: Secondary | ICD-10-CM

## 2022-03-16 NOTE — Progress Notes (Signed)
? ?  S:    ?Christian Matthews is a 61 y.o. male who presents for hypertension evaluation, education, and management. PMH is significant for HTN, tobacco abuse, HLD. Patient was referred and last seen by Primary Care Provider, Dr. Alvis Lemmings, on 12/28/21, when BP was 159/88. We saw him on 02/05/2022 and continued his current regimen. Non-adherence was suspected at that visit and we made no changes but encouraged compliance.  ? ?Today, he arrives in good spirits and presents without assistance. Denies dizziness, headache, blurred vision, swelling.  ? ?Family/Social history:  ?-Current smoker ? ?Medication adherence reported. He tells me he's doing a better job in general with prioritizing his health and taking his antihypertensives.  ? ?Current antihypertensives include: losartan/HCTZ 100/25 mg daily, amlodipine 10 mg daily, carvedilol 12.5 mg BID ? ?Antihypertensives tried in the past include: none ? ?Reported home BP readings: has upper arm cuff but hasn't checked recently ? ?Patient reported dietary habits: endorses low salt diet, no coffee, occasional soda  ? ?Patient-reported exercise habits: walks 4-5 hours a day ? ?O:  ?Vitals:  ? 03/16/22 1108  ?BP: 129/78  ?Pulse: 69  ? ? ?Last 3 Office BP readings: ?BP Readings from Last 3 Encounters:  ?03/16/22 129/78  ?02/05/22 (!) 155/84  ?12/28/21 (!) 159/88  ? ? ?BMET ?   ?Component Value Date/Time  ? NA 138 02/05/2022 1633  ? K 3.9 02/05/2022 1633  ? CL 99 02/05/2022 1633  ? CO2 25 02/05/2022 1633  ? GLUCOSE 85 02/05/2022 1633  ? GLUCOSE 87 01/30/2020 1934  ? BUN 15 02/05/2022 1633  ? CREATININE 0.97 02/05/2022 1633  ? CALCIUM 10.0 02/05/2022 1633  ? GFRNONAA 94 05/03/2020 1510  ? GFRAA 109 05/03/2020 1510  ? ?Renal function: ?CrCl cannot be calculated (Patient's most recent lab result is older than the maximum 21 days allowed.). ? ?Clinical ASCVD: No  ?The 10-year ASCVD risk score (Arnett DK, et al., 2019) is: 21.9% ?  Values used to calculate the score: ?    Age: 71 years ?     Sex: Male ?    Is Non-Hispanic African American: Yes ?    Diabetic: No ?    Tobacco smoker: Yes ?    Systolic Blood Pressure: 129 mmHg ?    Is BP treated: Yes ?    HDL Cholesterol: 81 mg/dL ?    Total Cholesterol: 204 mg/dL ? ? ?A/P: ?Hypertension diagnosed currently just at goal on current medications. BP goal < 130/80 mmHg. Medication adherence reported, however, this has been a barrier for him in the past. Commended pt for his improved adherence! At this time, I recommend to continue current medications. ?-Continue current medications.  ?-Counseled on lifestyle modifications for blood pressure control including reduced dietary sodium, increased exercise, adequate sleep. ?-Encouraged patient to check BP at home and bring log of readings to next visit. Counseled on proper use of home BP cuff.  ? ?Results reviewed and written information provided. Patient verbalized understanding of treatment plan. Total time in face-to-face counseling 30 minutes.  ? ?F/u pharmacist clinic visit in June. Sees his PCP 04/12/2022.  ? ?Butch Penny, PharmD, BCACP, CPP ?Clinical Pharmacist ?[facility] ?[phone] ? ? ?

## 2022-04-12 ENCOUNTER — Ambulatory Visit: Payer: Self-pay | Admitting: Family Medicine

## 2022-04-18 ENCOUNTER — Other Ambulatory Visit: Payer: Self-pay

## 2022-04-19 ENCOUNTER — Ambulatory Visit: Payer: 59 | Admitting: Pharmacist

## 2022-04-25 ENCOUNTER — Other Ambulatory Visit: Payer: Self-pay

## 2022-05-25 ENCOUNTER — Ambulatory Visit: Payer: 59 | Attending: Family Medicine | Admitting: Pharmacist

## 2022-05-25 ENCOUNTER — Other Ambulatory Visit: Payer: Self-pay

## 2022-05-25 VITALS — BP 113/71

## 2022-05-25 DIAGNOSIS — I10 Essential (primary) hypertension: Secondary | ICD-10-CM | POA: Diagnosis not present

## 2022-05-25 NOTE — Progress Notes (Signed)
   S:    Christian Matthews is a 61 y.o. male who presents for hypertension evaluation, education, and management. PMH is significant for HTN, tobacco abuse, HLD. Patient was referred and last seen by Primary Care Provider, Dr. Alvis Lemmings, on 12/28/21, when BP was 159/88. We saw him on 03/16/2022 and BP was good.   Today, pt arrives in good spirits and presents without assistance. Denies dizziness, headache, blurred vision, swelling.   Family/Social history:  -Current smoker (tells me a pack last several days) -Alcohol: none reported   Medication adherence reported. He tells me he's doing a better job in general with prioritizing his health and taking his antihypertensives. Tells me he's taken all 3 this morning.   Current antihypertensives include: losartan/HCTZ 100/25 mg daily, amlodipine 10 mg daily, carvedilol 12.5 mg BID  Antihypertensives tried in the past include: none  Reported home BP readings: has not been checking at home.   Patient reported dietary habits:  -Endorses low salt diet -Caffeine: no coffee, occasional soda   Patient-reported exercise habits:  - Not as active with the heat outside  O:  Vitals:   05/25/22 1016  BP: 113/71    Last 3 Office BP readings: BP Readings from Last 3 Encounters:  05/25/22 113/71  03/16/22 129/78  02/05/22 (!) 155/84    BMET    Component Value Date/Time   NA 138 02/05/2022 1633   K 3.9 02/05/2022 1633   CL 99 02/05/2022 1633   CO2 25 02/05/2022 1633   GLUCOSE 85 02/05/2022 1633   GLUCOSE 87 01/30/2020 1934   BUN 15 02/05/2022 1633   CREATININE 0.97 02/05/2022 1633   CALCIUM 10.0 02/05/2022 1633   GFRNONAA 94 05/03/2020 1510   GFRAA 109 05/03/2020 1510   Renal function: CrCl cannot be calculated (Patient's most recent lab result is older than the maximum 21 days allowed.).  Clinical ASCVD: No  The 10-year ASCVD risk score (Arnett DK, et al., 2019) is: 17.5%   Values used to calculate the score:     Age: 73 years     Sex:  Male     Is Non-Hispanic African American: Yes     Diabetic: No     Tobacco smoker: Yes     Systolic Blood Pressure: 113 mmHg     Is BP treated: Yes     HDL Cholesterol: 81 mg/dL     Total Cholesterol: 204 mg/dL   A/P: Hypertension diagnosed currently at goal on current medications. BP goal < 130/80 mmHg. Medication adherence reported, however, this has been a barrier for him in the past. Commended pt for his improved adherence! At this time, I recommend to continue current medications. -Continue current medications.  -Counseled on lifestyle modifications for blood pressure control including reduced dietary sodium, increased exercise, adequate sleep. -Encouraged patient to check BP at home and bring log of readings to next visit. Counseled on proper use of home BP cuff.   Results reviewed and written information provided. Patient verbalized understanding of treatment plan. Total time in face-to-face counseling 30 minutes.   F/u PCP clinic visit in November.   Butch Penny, PharmD, [place]y [place], CPP Clinical Pharmacist [facility] [phone]

## 2022-09-24 ENCOUNTER — Other Ambulatory Visit: Payer: Self-pay

## 2022-09-25 ENCOUNTER — Encounter: Payer: Self-pay | Admitting: Family Medicine

## 2022-09-25 ENCOUNTER — Other Ambulatory Visit: Payer: Self-pay

## 2022-09-25 ENCOUNTER — Ambulatory Visit: Payer: Self-pay | Attending: Family Medicine | Admitting: Family Medicine

## 2022-09-25 VITALS — BP 140/70 | HR 94 | Temp 98.5°F | Ht 65.0 in | Wt 114.4 lb

## 2022-09-25 DIAGNOSIS — I700 Atherosclerosis of aorta: Secondary | ICD-10-CM

## 2022-09-25 DIAGNOSIS — Z1211 Encounter for screening for malignant neoplasm of colon: Secondary | ICD-10-CM

## 2022-09-25 DIAGNOSIS — I10 Essential (primary) hypertension: Secondary | ICD-10-CM

## 2022-09-25 NOTE — Progress Notes (Signed)
Subjective:  Patient ID: Christian Matthews, male    DOB: 09-10-1961  Age: 61 y.o. MRN: [id number]  CC: Hypertension   HPI Christian Matthews is a 61 y.o. year old male with a history of hypertension, tobacco abuse who presents today for chronic disease management.   Interval History:  He endorses adherence with his antihypertensives and his statin.  He has no chest pain or dyspnea and needed to see a palpitations or lightheadedness.  Continues to smoke and is not ready to quit. Low-dose chest CT for lung cancer screening was negative at his last visit. He denies additional concerns.  Past Medical History:  Diagnosis Date   Essential hypertension 03/30/2020    No past surgical history on file.  No family history on file.  Social History   Socioeconomic History   Marital status: Single    Spouse name: Not on file   Number of children: Not on file   Years of education: Not on file   Highest education level: Not on file  Occupational History   Not on file  Tobacco Use   Smoking status: Every Day   Smokeless tobacco: Never  Substance and Sexual Activity   Alcohol use: Yes   Drug use: Not on file   Sexual activity: Not on file  Other Topics Concern   Not on file  Social History Narrative   Not on file   Social Determinants of Health   Financial Resource Strain: Not on file  Food Insecurity: Not on file  Transportation Needs: Unmet Transportation Needs (03/15/2022)   PRAPARE - Hydrologist (Medical): Yes    Lack of Transportation (Non-Medical): Yes  Physical Activity: Not on file  Stress: Not on file  Social Connections: Not on file    No Known Allergies  Outpatient Medications Prior to Visit  Medication Sig Dispense Refill   amLODipine (NORVASC) 10 MG tablet TAKE 1 TABLET (10 MG TOTAL) BY MOUTH DAILY. 30 tablet 6   atorvastatin (LIPITOR) 20 MG tablet Take 1 tablet (20 mg total) by mouth daily. 90 tablet 1   carvedilol (COREG) 12.5 MG tablet  Take 1 tablet (12.5 mg total) by mouth 2 (two) times daily with a meal. 60 tablet 6   losartan-hydrochlorothiazide (HYZAAR) 100-25 MG tablet TAKE 1 TABLET BY MOUTH DAILY. 30 tablet 6   Multiple Vitamin (MULTIVITAMIN WITH MINERALS) TABS tablet Take 1 tablet by mouth daily. 30 tablet 0   thiamine 100 MG tablet Take 1 tablet (100 mg total) by mouth daily. 30 tablet 0   nicotine (NICODERM CQ) 7 mg/24hr patch Place 1 patch (7 mg total) onto the skin daily. (Patient not taking: Reported on 12/28/2021) 28 patch 1   No facility-administered medications prior to visit.     ROS Review of Systems  Constitutional:  Negative for activity change and appetite change.  HENT:  Negative for sinus pressure and sore throat.   Respiratory:  Negative for chest tightness, shortness of breath and wheezing.   Cardiovascular:  Negative for chest pain and palpitations.  Gastrointestinal:  Negative for abdominal distention, abdominal pain and constipation.  Genitourinary: Negative.   Musculoskeletal: Negative.   Psychiatric/Behavioral:  Negative for behavioral problems and dysphoric mood.     Objective:  BP (!) 140/70   Pulse 94   Temp 98.5 F (36.9 C) (Oral)   Ht _0  (1.651 m)   Wt 114 lb 6.4 oz (51.9 kg)   SpO2 99%   BMI 19.04 kg/m  09/25/2022   11:17 AM 09/25/2022   10:39 AM 05/25/2022   10:16 AM  BP/Weight  Systolic BP 211 941 740  Diastolic BP 70 77 71  Wt. (Lbs)  114.4   BMI  19.04 kg/m2       Physical Exam Constitutional:      Appearance: He is well-developed.  Cardiovascular:     Rate and Rhythm: Normal rate.     Heart sounds: Normal heart sounds. No murmur heard. Pulmonary:     Effort: Pulmonary effort is normal.     Breath sounds: Normal breath sounds. No wheezing or rales.  Chest:     Chest wall: No tenderness.  Abdominal:     General: Bowel sounds are normal. There is no distension.     Palpations: Abdomen is soft. There is no mass.     Tenderness: There is no abdominal  tenderness.  Musculoskeletal:        General: Normal range of motion.     Right lower leg: No edema.     Left lower leg: No edema.  Neurological:     Mental Status: He is alert and oriented to person, place, and time.  Psychiatric:        Mood and Affect: Mood normal.        Latest Ref Rng & Units 02/05/2022    4:33 PM 03/15/2021   10:25 AM 02/07/2021   11:09 AM  CMP  Glucose 70 - 99 mg/dL 85  82  85   BUN 8 - 27 mg/dL _0 Creatinine 0.76 - 1.27 mg/dL 0.97  0.98  0.84   Sodium 134 - 144 mmol/L 138  142  142   Potassium 3.5 - 5.2 mmol/L 3.9  3.7  4.2   Chloride 96 - 106 mmol/L 99  100  101   CO2 20 - 29 mmol/L _1 Calcium 8.6 - 10.2 mg/dL 10.0  10.4  10.1   Total Protein 6.0 - 8.5 g/dL 7.7   8.6   Total Bilirubin 0.0 - 1.2 mg/dL 0.6   0.4   Alkaline Phos 44 - 121 IU/L 95   105   AST 0 - 40 IU/L 31   85   ALT 0 - 44 IU/L 29   72     Lipid Panel     Component Value Date/Time   CHOL 204 (H) 02/07/2021 1109   TRIG 126 02/07/2021 1109   HDL 81 02/07/2021 1109   CHOLHDL 2.5 02/07/2021 1109   LDLCALC 101 (H) 02/07/2021 1109    CBC    Component Value Date/Time   WBC 3.5 (L) 01/30/2020 1934   RBC 4.53 01/30/2020 1934   HGB 14.3 01/30/2020 1934   HCT 41.4 01/30/2020 1934   PLT 181 01/30/2020 1934   MCV 91.4 01/30/2020 1934   MCH 31.6 01/30/2020 1934   MCHC 34.5 01/30/2020 1934   RDW 14.0 01/30/2020 1934    No results found for: "HGBA1C"  Assessment & Plan:  1. Atherosclerosis of aorta (HCC) Currently on atorvastatin 20 mg Continue low-cholesterol diet - LP+Non-HDL Cholesterol  2. Screening for colon cancer - Fecal occult blood, imunochemical([facility])  3. Essential hypertension Slightly above goal We will continue current regimen and reassess at next visit Consider switching his ARB/diuretic regimen if blood pressure is still above goal Counseled on blood pressure goal of less than 130/80, low-sodium, DASH diet, medication compliance,  150 minutes of moderate intensity exercise per  week. Discussed medication compliance, adverse effects. - CMP14+EGFR - CBC with Differential/Platelet   Health Care Maintenance: Up-to-date on lung cancer screening.  Fit test ordered to screen for colonoscopy. No orders of the defined types were placed in this encounter.   Follow-up: Return in about 3 months (around 12/26/2022) for Chronic medical conditions.       Charlott Rakes, MD, FAAFP. [facility], [place]   09/25/2022, 12:30 PM

## 2022-09-25 NOTE — Patient Instructions (Signed)

## 2022-09-26 ENCOUNTER — Other Ambulatory Visit: Payer: Self-pay

## 2022-09-26 LAB — CMP14+EGFR
ALT: 21 IU/L (ref 0–44)
AST: 33 IU/L (ref 0–40)
Albumin/Globulin Ratio: 1.4 (ref 1.2–2.2)
Albumin: 4.9 g/dL (ref 3.9–4.9)
Alkaline Phosphatase: 100 IU/L (ref 44–121)
BUN/Creatinine Ratio: 8 — ABNORMAL LOW (ref 10–24)
BUN: 7 mg/dL — ABNORMAL LOW (ref 8–27)
Bilirubin Total: 0.4 mg/dL (ref 0.0–1.2)
CO2: 26 mmol/L (ref 20–29)
Calcium: 10 mg/dL (ref 8.6–10.2)
Chloride: 100 mmol/L (ref 96–106)
Creatinine, Ser: 0.89 mg/dL (ref 0.76–1.27)
Globulin, Total: 3.4 g/dL (ref 1.5–4.5)
Glucose: 91 mg/dL (ref 70–99)
Potassium: 4.3 mmol/L (ref 3.5–5.2)
Sodium: 140 mmol/L (ref 134–144)
Total Protein: 8.3 g/dL (ref 6.0–8.5)
eGFR: 96 mL/min/{1.73_m2} (ref >59)

## 2022-09-26 LAB — CBC WITH DIFFERENTIAL/PLATELET
Basophils Absolute: 0 10*3/uL (ref 0.0–0.2)
Basos: 1 %
EOS (ABSOLUTE): 0.1 10*3/uL (ref 0.0–0.4)
Eos: 3 %
Hematocrit: 37.9 % (ref 37.5–51.0)
Hemoglobin: 12.7 g/dL — ABNORMAL LOW (ref 13.0–17.7)
Immature Grans (Abs): 0 10*3/uL (ref 0.0–0.1)
Immature Granulocytes: 0 %
Lymphocytes Absolute: 0.9 10*3/uL (ref 0.7–3.1)
Lymphs: 31 %
MCH: 30.9 pg (ref 26.6–33.0)
MCHC: 33.5 g/dL (ref 31.5–35.7)
MCV: 92 fL (ref 79–97)
Monocytes Absolute: 0.4 10*3/uL (ref 0.1–0.9)
Monocytes: 13 %
Neutrophils Absolute: 1.6 10*3/uL (ref 1.4–7.0)
Neutrophils: 52 %
Platelets: 240 10*3/uL (ref 150–450)
RBC: 4.11 x10E6/uL — ABNORMAL LOW (ref 4.14–5.80)
RDW: 13.2 % (ref 11.6–15.4)
WBC: 3 10*3/uL — ABNORMAL LOW (ref 3.4–10.8)

## 2022-09-26 LAB — LP+NON-HDL CHOLESTEROL
Cholesterol, Total: 180 mg/dL (ref 100–199)
HDL: 86 mg/dL (ref >39)
LDL Chol Calc (NIH): 81 mg/dL (ref 0–99)
Total Non-HDL-Chol (LDL+VLDL): 94 mg/dL (ref 0–129)
Triglycerides: 67 mg/dL (ref 0–149)
VLDL Cholesterol Cal: 13 mg/dL (ref 5–40)

## 2022-11-06 ENCOUNTER — Other Ambulatory Visit: Payer: Self-pay

## 2022-12-28 ENCOUNTER — Telehealth: Payer: Self-pay

## 2022-12-28 NOTE — Telephone Encounter (Unsigned)
Copied from [place] [id number]. Topic: General - Other >> Dec 28, 2022  4:39 PM Ja-Kwan M wrote: Reason for CRM: Pt requests transportation to and from his appt on 01/01/23.

## 2022-12-31 ENCOUNTER — Ambulatory Visit: Payer: Self-pay

## 2022-12-31 ENCOUNTER — Other Ambulatory Visit: Payer: Self-pay | Admitting: Family Medicine

## 2022-12-31 DIAGNOSIS — I10 Essential (primary) hypertension: Secondary | ICD-10-CM

## 2022-12-31 NOTE — Telephone Encounter (Signed)
I called the patient and confirmed his address and informed him that I will arrange a ride with [facility] for him to his appointment at [facility] and pick up time will be 0915.  He said he understood.  I spoke to [place] and arranged the ride.voucher then sent to [facility]

## 2022-12-31 NOTE — Progress Notes (Unsigned)
Car arranged to his appointment at [facility] tomorrow. I also instructed him to see me when he is in the clinic and I can give him a Medicaid application as he may now be eligible under Medicaid expansion.

## 2023-01-01 ENCOUNTER — Ambulatory Visit: Payer: Self-pay | Attending: Family Medicine | Admitting: Family Medicine

## 2023-01-01 ENCOUNTER — Other Ambulatory Visit: Payer: Self-pay

## 2023-01-01 ENCOUNTER — Encounter: Payer: Self-pay | Admitting: Family Medicine

## 2023-01-01 VITALS — BP 133/80 | HR 84 | Temp 98.0°F | Ht 65.0 in | Wt 118.0 lb

## 2023-01-01 DIAGNOSIS — I700 Atherosclerosis of aorta: Secondary | ICD-10-CM

## 2023-01-01 DIAGNOSIS — Z1211 Encounter for screening for malignant neoplasm of colon: Secondary | ICD-10-CM

## 2023-01-01 DIAGNOSIS — I10 Essential (primary) hypertension: Secondary | ICD-10-CM

## 2023-01-01 MED ORDER — LOSARTAN POTASSIUM-HCTZ 100-25 MG PO TABS
1.0000 | ORAL_TABLET | Freq: Every day | ORAL | 1 refills | Status: DC
  Filled 2023-01-01 – 2023-05-02 (×2): qty 90, 90d supply, fill #0

## 2023-01-01 MED ORDER — ATORVASTATIN CALCIUM 20 MG PO TABS
20.0000 mg | ORAL_TABLET | Freq: Every day | ORAL | 1 refills | Status: DC
  Filled 2023-01-01: qty 90, 90d supply, fill #0
  Filled 2023-04-01 (×2): qty 90, 90d supply, fill #1

## 2023-01-01 MED ORDER — CARVEDILOL 12.5 MG PO TABS
12.5000 mg | ORAL_TABLET | Freq: Two times a day (BID) | ORAL | 1 refills | Status: DC
  Filled 2023-01-01 – 2023-04-01 (×6): qty 180, 90d supply, fill #0
  Filled 2023-09-23: qty 180, 90d supply, fill #1

## 2023-01-01 MED ORDER — AMLODIPINE BESYLATE 10 MG PO TABS
10.0000 mg | ORAL_TABLET | Freq: Every day | ORAL | 1 refills | Status: DC
  Filled 2023-01-01 – 2023-04-01 (×3): qty 90, 90d supply, fill #0
  Filled 2023-09-23: qty 90, 90d supply, fill #1

## 2023-01-01 NOTE — Patient Instructions (Signed)

## 2023-01-01 NOTE — Progress Notes (Signed)
Subjective:  Patient ID: Christian Matthews, male    DOB: Dec 12, 1960  Age: 62 y.o. MRN: [id number]  CC: Hypertension   HPI Christian Matthews is a 62 y.o. year old male with a history of hypertension, tobacco abuse who presents today for chronic disease management.    Interval History:  He took all his medications this morning and endorses adherence with his statin.  He exercises about 3-4 times a week by means of walking. He has no chest pain or dyspnea. Denies presence of additional concerns today. Continues to smoke and is not ready to quit. Past Medical History:  Diagnosis Date   Essential hypertension 03/30/2020    No past surgical history on file.  No family history on file.  Social History   Socioeconomic History   Marital status: Single    Spouse name: Not on file   Number of children: Not on file   Years of education: Not on file   Highest education level: Not on file  Occupational History   Not on file  Tobacco Use   Smoking status: Every Day   Smokeless tobacco: Never  Substance and Sexual Activity   Alcohol use: Yes   Drug use: Not on file   Sexual activity: Not on file  Other Topics Concern   Not on file  Social History Narrative   Not on file   Social Determinants of Health   Financial Resource Strain: Not on file  Food Insecurity: Not on file  Transportation Needs: Unmet Transportation Needs (12/31/2022)   PRAPARE - Hydrologist (Medical): Yes    Lack of Transportation (Non-Medical): Yes  Physical Activity: Not on file  Stress: Not on file  Social Connections: Not on file    No Known Allergies  Outpatient Medications Prior to Visit  Medication Sig Dispense Refill   Multiple Vitamin (MULTIVITAMIN WITH MINERALS) TABS tablet Take 1 tablet by mouth daily. 30 tablet 0   thiamine 100 MG tablet Take 1 tablet (100 mg total) by mouth daily. 30 tablet 0   atorvastatin (LIPITOR) 20 MG tablet Take 1 tablet (20 mg total) by mouth daily.  90 tablet 1   carvedilol (COREG) 12.5 MG tablet Take 1 tablet (12.5 mg total) by mouth 2 (two) times daily with a meal. 60 tablet 6   nicotine (NICODERM CQ) 7 mg/24hr patch Place 1 patch (7 mg total) onto the skin daily. (Patient not taking: Reported on 12/28/2021) 28 patch 1   amLODipine (NORVASC) 10 MG tablet TAKE 1 TABLET (10 MG TOTAL) BY MOUTH DAILY. 30 tablet 6   losartan-hydrochlorothiazide (HYZAAR) 100-25 MG tablet TAKE 1 TABLET BY MOUTH DAILY. 30 tablet 6   No facility-administered medications prior to visit.     ROS Review of Systems  Constitutional:  Negative for activity change and appetite change.  HENT:  Negative for sinus pressure and sore throat.   Respiratory:  Negative for chest tightness, shortness of breath and wheezing.   Cardiovascular:  Negative for chest pain and palpitations.  Gastrointestinal:  Negative for abdominal distention, abdominal pain and constipation.  Genitourinary: Negative.   Musculoskeletal: Negative.   Psychiatric/Behavioral:  Negative for behavioral problems and dysphoric mood.     Objective:  BP 133/80   Pulse 84   Temp 98 F (36.7 C) (Oral)   Ht 5' 5"$  (1.651 m)   Wt 118 lb (53.5 kg)   SpO2 98%   BMI 19.64 kg/m      01/01/2023   10:55  AM 01/01/2023   10:04 AM 09/25/2022   11:17 AM  BP/Weight  Systolic BP [id number]  Diastolic BP 80 73 70  Wt. (Lbs)  118   BMI  19.64 kg/m2       Physical Exam Constitutional:      Appearance: He is well-developed.  Cardiovascular:     Rate and Rhythm: Normal rate.     Heart sounds: Normal heart sounds. No murmur heard. Pulmonary:     Effort: Pulmonary effort is normal.     Breath sounds: Normal breath sounds. No wheezing or rales.  Chest:     Chest wall: No tenderness.  Abdominal:     General: Bowel sounds are normal. There is no distension.     Palpations: Abdomen is soft. There is no mass.     Tenderness: There is no abdominal tenderness.  Musculoskeletal:        General: Normal  range of motion.     Right lower leg: No edema.     Left lower leg: No edema.  Neurological:     Mental Status: He is alert and oriented to person, place, and time.  Psychiatric:        Mood and Affect: Mood normal.        Latest Ref Rng & Units 09/25/2022   11:35 AM 02/05/2022    4:33 PM 03/15/2021   10:25 AM  CMP  Glucose 70 - 99 mg/dL 91  85  82   BUN 8 - 27 mg/dL 7  15  11   $ Creatinine 0.76 - 1.27 mg/dL 0.89  0.97  0.98   Sodium 134 - 144 mmol/L 140  138  142   Potassium 3.5 - 5.2 mmol/L 4.3  3.9  3.7   Chloride 96 - 106 mmol/L 100  99  100   CO2 20 - 29 mmol/L 26  25  24   $ Calcium 8.6 - 10.2 mg/dL 10.0  10.0  10.4   Total Protein 6.0 - 8.5 g/dL 8.3  7.7    Total Bilirubin 0.0 - 1.2 mg/dL 0.4  0.6    Alkaline Phos 44 - 121 IU/L 100  95    AST 0 - 40 IU/L 33  31    ALT 0 - 44 IU/L 21  29      Lipid Panel     Component Value Date/Time   CHOL 180 09/25/2022 1135   TRIG 67 09/25/2022 1135   HDL 86 09/25/2022 1135   CHOLHDL 2.5 02/07/2021 1109   LDLCALC 81 09/25/2022 1135    CBC    Component Value Date/Time   WBC 3.0 (L) 09/25/2022 1135   WBC 3.5 (L) 01/30/2020 1934   RBC 4.11 (L) 09/25/2022 1135   RBC 4.53 01/30/2020 1934   HGB 12.7 (L) 09/25/2022 1135   HCT 37.9 09/25/2022 1135   PLT 240 09/25/2022 1135   MCV 92 09/25/2022 1135   MCH 30.9 09/25/2022 1135   MCH 31.6 01/30/2020 1934   MCHC 33.5 09/25/2022 1135   MCHC 34.5 01/30/2020 1934   RDW 13.2 09/25/2022 1135   LYMPHSABS 0.9 09/25/2022 1135   EOSABS 0.1 09/25/2022 1135   BASOSABS 0.0 09/25/2022 1135    No results found for: "HGBA1C"  Assessment & Plan:  1. Essential hypertension Controlled Continue current regimen Counseled on blood pressure goal of less than 130/80, low-sodium, DASH diet, medication compliance, 150 minutes of moderate intensity exercise per week. Discussed medication compliance, adverse effects. - amLODipine (NORVASC) 10 MG  tablet; Take 1 tablet (10 mg total) by mouth daily.   Dispense: 90 tablet; Refill: 1 - carvedilol (COREG) 12.5 MG tablet; Take 1 tablet (12.5 mg total) by mouth 2 (two) times daily with a meal.  Dispense: 180 tablet; Refill: 1 - losartan-hydrochlorothiazide (HYZAAR) 100-25 MG tablet; TAKE 1 TABLET BY MOUTH DAILY.  Dispense: 90 tablet; Refill: 1  2. Screening for colon cancer - Fecal occult blood, imunochemical([facility])  3. Atherosclerosis of aorta (HCC) Controlled Continue atorvastatin Low-cholesterol diet - atorvastatin (LIPITOR) 20 MG tablet; Take 1 tablet (20 mg total) by mouth daily.  Dispense: 90 tablet; Refill: 1    Meds ordered this encounter  Medications   amLODipine (NORVASC) 10 MG tablet    Sig: Take 1 tablet (10 mg total) by mouth daily.    Dispense:  90 tablet    Refill:  1   atorvastatin (LIPITOR) 20 MG tablet    Sig: Take 1 tablet (20 mg total) by mouth daily.    Dispense:  90 tablet    Refill:  1   carvedilol (COREG) 12.5 MG tablet    Sig: Take 1 tablet (12.5 mg total) by mouth 2 (two) times daily with a meal.    Dispense:  180 tablet    Refill:  1   losartan-hydrochlorothiazide (HYZAAR) 100-25 MG tablet    Sig: TAKE 1 TABLET BY MOUTH DAILY.    Dispense:  90 tablet    Refill:  1    Follow-up: Return in about 3 months (around 04/01/2023) for Chronic medical conditions.       Charlott Rakes, MD, FAAFP. [facility] and [place], [place]   01/01/2023, 11:40 AM

## 2023-01-07 ENCOUNTER — Other Ambulatory Visit: Payer: Self-pay

## 2023-04-01 ENCOUNTER — Other Ambulatory Visit: Payer: Self-pay

## 2023-04-01 ENCOUNTER — Ambulatory Visit: Payer: Medicaid Other | Attending: Family Medicine | Admitting: Family Medicine

## 2023-04-01 ENCOUNTER — Encounter: Payer: Self-pay | Admitting: Family Medicine

## 2023-04-01 VITALS — BP 177/75 | HR 73 | Ht 65.0 in | Wt 117.8 lb

## 2023-04-01 DIAGNOSIS — F17210 Nicotine dependence, cigarettes, uncomplicated: Secondary | ICD-10-CM

## 2023-04-01 DIAGNOSIS — R42 Dizziness and giddiness: Secondary | ICD-10-CM

## 2023-04-01 DIAGNOSIS — Z1211 Encounter for screening for malignant neoplasm of colon: Secondary | ICD-10-CM | POA: Diagnosis not present

## 2023-04-01 DIAGNOSIS — I10 Essential (primary) hypertension: Secondary | ICD-10-CM | POA: Diagnosis not present

## 2023-04-01 MED ORDER — MECLIZINE HCL 25 MG PO TABS
25.0000 mg | ORAL_TABLET | Freq: Three times a day (TID) | ORAL | 1 refills | Status: DC | PRN
  Filled 2023-04-01: qty 60, 20d supply, fill #0
  Filled 2023-05-02: qty 60, 20d supply, fill #1

## 2023-04-01 MED ORDER — HYDRALAZINE HCL 10 MG PO TABS
50.0000 mg | ORAL_TABLET | Freq: Once | ORAL | Status: AC
  Administered 2023-04-01: 50 mg via ORAL

## 2023-04-01 NOTE — Progress Notes (Signed)
Discuss BP medication.

## 2023-04-01 NOTE — Progress Notes (Signed)
Subjective:  Patient ID: Christian Matthews, male    DOB: [birth year]  Age: 62 y.o. MRN: [id number]  CC: Hypertension   HPI Burchell Gonya is a 62 y.o. year old male with a history of hypertension, tobacco abuse who presents today for chronic disease management.    Interval History:  He Complains of dizziness which he attributes to his antihypertensive. It starts when he gets up out of the bed. Sometimes when he turns his head from left to right he feels dizzy. He endorses taking his antihypertensives today.  He would also like to inform me he is in the process of applying for disability based on the fact that he is unable to do the things he used to do. He has cramping in his hands and cannot stand for too long.  Past Medical History:  Diagnosis Date   Essential hypertension 03/30/2020    History reviewed. No pertinent surgical history.  History reviewed. No pertinent family history.  Social History   Socioeconomic History   Marital status: Single    Spouse name: Not on file   Number of children: Not on file   Years of education: Not on file   Highest education level: Not on file  Occupational History   Not on file  Tobacco Use   Smoking status: Every Day   Smokeless tobacco: Never  Substance and Sexual Activity   Alcohol use: Yes   Drug use: Not on file   Sexual activity: Not on file  Other Topics Concern   Not on file  Social History Narrative   Not on file   Social Determinants of Health   Financial Resource Strain: Not on file  Food Insecurity: Not on file  Transportation Needs: Unmet Transportation Needs (12/31/2022)   PRAPARE - Administrator, Civil Service (Medical): Yes    Lack of Transportation (Non-Medical): Not on file  Physical Activity: Not on file  Stress: Not on file  Social Connections: Not on file    No Known Allergies  Outpatient Medications Prior to Visit  Medication Sig Dispense Refill   amLODipine (NORVASC) 10 MG tablet Take 1  tablet (10 mg total) by mouth daily. 90 tablet 1   carvedilol (COREG) 12.5 MG tablet Take 1 tablet (12.5 mg total) by mouth 2 (two) times daily with a meal. 180 tablet 1   losartan-hydrochlorothiazide (HYZAAR) 100-25 MG tablet TAKE 1 TABLET BY MOUTH DAILY. 90 tablet 1   atorvastatin (LIPITOR) 20 MG tablet Take 1 tablet (20 mg total) by mouth daily. (Patient not taking: Reported on 04/01/2023) 90 tablet 1   Multiple Vitamin (MULTIVITAMIN WITH MINERALS) TABS tablet Take 1 tablet by mouth daily. (Patient not taking: Reported on 04/01/2023) 30 tablet 0   nicotine (NICODERM CQ) 7 mg/24hr patch Place 1 patch (7 mg total) onto the skin daily. (Patient not taking: Reported on 12/28/2021) 28 patch 1   thiamine 100 MG tablet Take 1 tablet (100 mg total) by mouth daily. (Patient not taking: Reported on 04/01/2023) 30 tablet 0   No facility-administered medications prior to visit.     ROS Review of Systems  Constitutional:  Negative for activity change and appetite change.  HENT:  Negative for sinus pressure and sore throat.   Respiratory:  Negative for chest tightness, shortness of breath and wheezing.   Cardiovascular:  Negative for chest pain and palpitations.  Gastrointestinal:  Negative for abdominal distention, abdominal pain and constipation.  Genitourinary: Negative.   Musculoskeletal:  See HPI  Neurological:  Positive for light-headedness.  Psychiatric/Behavioral:  Negative for behavioral problems and dysphoric mood.     Objective:  BP (!) 177/75   Pulse 73   Ht 5\' 5"  (1.651 m)   Wt 117 lb 12.8 oz (53.4 kg)   SpO2 100%   BMI 19.60 kg/m      04/01/2023   10:26 AM 01/01/2023   10:55 AM 01/01/2023   10:04 AM  BP/Weight  Systolic BP 177 133 163  Diastolic BP 75 80 73  Wt. (Lbs) 117.8  118  BMI 19.6 kg/m2  19.64 kg/m2  Orthostatic VS for the past 72 hrs (Last 3 readings):  Orthostatic BP Patient Position Orthostatic Pulse  04/01/23 1048 (!) 203/98 Standing 75  04/01/23 1047 (!)  213/84 Sitting 71  04/01/23 1046 (!) 212/101 Supine 69      Physical Exam Constitutional:      Appearance: He is well-developed.  HENT:     Right Ear: Tympanic membrane normal.     Left Ear: Tympanic membrane normal.     Ears:     Comments: Positive Dix-Hallpike maneuver Cardiovascular:     Rate and Rhythm: Normal rate.     Heart sounds: Normal heart sounds. No murmur heard. Pulmonary:     Effort: Pulmonary effort is normal.     Breath sounds: Normal breath sounds. No wheezing or rales.  Chest:     Chest wall: No tenderness.  Abdominal:     General: Bowel sounds are normal. There is no distension.     Palpations: Abdomen is soft. There is no mass.     Tenderness: There is no abdominal tenderness.  Musculoskeletal:        General: Normal range of motion.     Right lower leg: No edema.     Left lower leg: No edema.  Neurological:     Mental Status: He is alert and oriented to person, place, and time.  Psychiatric:        Mood and Affect: Mood normal.        Latest Ref Rng & Units 09/25/2022   11:35 AM 02/05/2022    4:33 PM 03/15/2021   10:25 AM  CMP  Glucose 70 - 99 mg/dL 91  85  82   BUN 8 - 27 mg/dL 7  15  11    Creatinine 0.76 - 1.27 mg/dL 1.61  0.96  0.45   Sodium 134 - 144 mmol/L 140  138  142   Potassium 3.5 - 5.2 mmol/L 4.3  3.9  3.7   Chloride 96 - 106 mmol/L 100  99  100   CO2 20 - 29 mmol/L 26  25  24    Calcium 8.6 - 10.2 mg/dL 40.9  81.1  91.4   Total Protein 6.0 - 8.5 g/dL 8.3  7.7    Total Bilirubin 0.0 - 1.2 mg/dL 0.4  0.6    Alkaline Phos 44 - 121 IU/L 100  95    AST 0 - 40 IU/L 33  31    ALT 0 - 44 IU/L 21  29      Lipid Panel     Component Value Date/Time   CHOL 180 09/25/2022 1135   TRIG 67 09/25/2022 1135   HDL 86 09/25/2022 1135   CHOLHDL 2.5 02/07/2021 1109   LDLCALC 81 09/25/2022 1135    CBC    Component Value Date/Time   WBC 3.0 (L) 09/25/2022 1135   WBC 3.5 (L) 01/30/2020 1934  RBC 4.11 (L) 09/25/2022 1135   RBC 4.53  01/30/2020 1934   HGB 12.7 (L) 09/25/2022 1135   HCT 37.9 09/25/2022 1135   PLT 240 09/25/2022 1135   MCV 92 09/25/2022 1135   MCH 30.9 09/25/2022 1135   MCH 31.6 01/30/2020 1934   MCHC 33.5 09/25/2022 1135   MCHC 34.5 01/30/2020 1934   RDW 13.2 09/25/2022 1135   LYMPHSABS 0.9 09/25/2022 1135   EOSABS 0.1 09/25/2022 1135   BASOSABS 0.0 09/25/2022 1135    No results found for: "HGBA1C"  Assessment & Plan:  1. Vertigo Discussed pathophysiology of vertigo - meclizine (ANTIVERT) 25 MG tablet; Take 1 tablet (25 mg total) by mouth 3 (three) times daily as needed for dizziness.  Dispense: 60 tablet; Refill: 1 - Ambulatory referral to Physical Therapy  2. Screening for colon cancer - Ambulatory referral to Gastroenterology  3. Smoking greater than 20 pack years Spent 3 minutes counseling on smoking cessation but he is not ready to quit - CT CHEST LUNG CANCER SCREENING LOW DOSE WO CONTRAST; Future  4. Essential hypertension Negative orthostatic vitals Surprisingly his repeat blood pressure was higher than his initial blood pressure Hydralazine 50 mg administered and blood pressure repeated His blood pressure was normal at his last visit Will have him follow-up with the clinical pharmacist and if blood pressure is still above goal consider substituting losartan/HCTZ with Diovan/HCTZ Counseled on blood pressure goal of less than 130/80, low-sodium, DASH diet, medication compliance, 150 minutes of moderate intensity exercise per week. Discussed medication compliance, adverse effects. - hydrALAZINE (APRESOLINE) tablet 50 mg    Meds ordered this encounter  Medications   meclizine (ANTIVERT) 25 MG tablet    Sig: Take 1 tablet (25 mg total) by mouth 3 (three) times daily as needed for dizziness.    Dispense:  60 tablet    Refill:  1   hydrALAZINE (APRESOLINE) tablet 50 mg    Follow-up: Return in about 1 month (around 05/02/2023) for Blood pressure follow-up with Franky Macho, Chronic  medical conditions PCP in 3 months.       Hoy Register, MD, FAAFP. [facility] [place], [place] [phone]   04/01/2023, 1:02 PM

## 2023-04-01 NOTE — Patient Instructions (Signed)
Vertigo Vertigo is the feeling that you or your surroundings are moving when they are not. This feeling can come and go at any time. Vertigo often goes away on its own. Vertigo can be dangerous if it occurs while you are doing something that could endanger yourself or others, such as driving or operating machinery. Your health care provider will do tests to try to determine the cause of your vertigo. Tests will also help your health care provider decide how best to treat your condition. Follow these instructions at home: Eating and drinking     Dehydration can make vertigo worse. Drink enough fluid to keep your urine pale yellow. Do not drink alcohol. Activity Return to your normal activities as told by your health care provider. Ask your health care provider what activities are safe for you. In the morning, first sit up on the side of the bed. When you feel okay, stand slowly while you hold onto something until you know that your balance is fine. Move slowly. Avoid sudden body or head movements or certain positions, as told by your health care provider. If you have trouble walking or keeping your balance, try using a cane for stability. If you feel dizzy or unstable, sit down right away. Avoid doing any tasks that would cause danger to you or others if vertigo occurs. Avoid bending down if you feel dizzy. Place items in your home so that they are easy for you to reach without bending or leaning over. Do not drive or use machinery if you feel dizzy. General instructions Take over-the-counter and prescription medicines only as told by your health care provider. Keep all follow-up visits. This is important. Contact a health care provider if: Your medicines do not relieve your vertigo or they make it worse. Your condition gets worse or you develop new symptoms. You have a fever. You develop nausea or vomiting, or if nausea gets worse. Your family or friends notice any behavioral changes. You  have numbness or a prickling and tingling sensation in part of your body. Get help right away if you: Are always dizzy or you faint. Develop severe headaches. Develop a stiff neck. Develop sensitivity to light. Have difficulty moving or speaking. Have weakness in your hands, arms, or legs. Have changes in your hearing or vision. These symptoms may represent a serious problem that is an emergency. Do not wait to see if the symptoms will go away. Get medical help right away. Call your local emergency services (911 in the [place] Do not drive yourself to the hospital. Summary Vertigo is the feeling that you or your surroundings are moving when they are not. Your health care provider will do tests to try to determine the cause of your vertigo. Follow instructions for home care. You may be told to avoid certain tasks, positions, or movements. Contact a health care provider if your medicines do not relieve your symptoms, or if you have a fever, nausea, vomiting, or changes in behavior. Get help right away if you have severe headaches or difficulty speaking, or you develop hearing or vision problems. This information is not intended to replace advice given to you by your health care provider. Make sure you discuss any questions you have with your health care provider. Document Revised: 10/05/2020 Document Reviewed: 10/05/2020 Elsevier Patient Education  2023 Elsevier Inc[employer]  

## 2023-04-08 ENCOUNTER — Ambulatory Visit: Payer: Medicaid Other | Attending: Family Medicine

## 2023-04-08 VITALS — BP 167/87

## 2023-04-08 DIAGNOSIS — R42 Dizziness and giddiness: Secondary | ICD-10-CM | POA: Diagnosis present

## 2023-04-08 DIAGNOSIS — R2681 Unsteadiness on feet: Secondary | ICD-10-CM | POA: Insufficient documentation

## 2023-04-08 NOTE — Therapy (Signed)
OUTPATIENT PHYSICAL THERAPY VESTIBULAR EVALUATION     Patient Name: Christian Matthews MRN: [id number] DOB:Jan 04, 1961, 62 y.o., male Today's Date: 04/08/2023  END OF SESSION:  PT End of Session - 04/08/23 1450     Visit Number 1    Number of Visits 5    Date for PT Re-Evaluation 05/10/23    Authorization Type [place] med[place] prepaid    PT Start Time 1448    PT Stop Time 1514    PT Time Calculation (min) 26 min    Activity Tolerance Patient tolerated treatment well    Behavior During Therapy [facility] for tasks assessed/performed             Past Medical History:  Diagnosis Date   Essential hypertension 03/30/2020   History reviewed. No pertinent surgical history. Patient Active Problem List   Diagnosis Date Noted   Atherosclerosis of aorta (HCC) 01/08/2022   Essential hypertension 03/30/2020   Smoking greater than 20 pack years 03/30/2020    PCP: Hoy Register, MD  REFERRING PROVIDER: Hoy Register, MD   REFERRING DIAG: R42 (ICD-10-CM) - Vertigo   THERAPY DIAG:  Unsteadiness on feet - Plan: PT plan of care cert/re-cert  Dizziness and giddiness - Plan: PT plan of care cert/re-cert  ONSET DATE:   04/01/2023  referral  Rationale for Evaluation and Treatment: Rehabilitation  SUBJECTIVE:   SUBJECTIVE STATEMENT: Patient arrives to clinic alone, no AD. States that he can't "rush and jump up like he used to." Denies taking Meclizine, states he doesn't feel like he needs it. Dizziness started months ago. Per patient, MD did not perform Dix hallpike at appt. Per patient, MD suggesting something going on with his neck contributing to his dizzines(?).  Pt accompanied by: self  PERTINENT HISTORY: HTN  PAIN:  Are you having pain?  More stiffness in low back and neck than pain  Vitals:   04/08/23 1504 04/08/23 1505  BP: (!) 158/88 (!) 167/87     PRECAUTIONS: None  WEIGHT BEARING RESTRICTIONS: No  FALLS: Has patient fallen in last 6 months? No  LIVING  ENVIRONMENT: Lives with: is homeless  PLOF: Independent  PATIENT GOALS: "reduce pain"  OBJECTIVE:   DIAGNOSTIC FINDINGS: non contributory   COGNITION: Overall cognitive status: Within functional limits for tasks assessed   SENSATION: Reports tingling in L arm when slightly dizzy    POSTURE:  No Significant postural limitations  Cervical ROM:   WFL  STRENGTH: WFL  BED MOBILITY:  Independent, but reports onset of dizziness with sitting up in bed  TRANSFERS: Assistive device utilized: None  Sit to stand: Complete Independence Stand to sit: Complete Independence Chair to chair: Complete Independence  GAIT: Gait pattern: WFL Distance walked: clinic   PATIENT SURVEYS:  FOTO 43; expected to be at 56  VESTIBULAR ASSESSMENT:  GENERAL OBSERVATION: NAD   SYMPTOM BEHAVIOR:  Subjective history: see above  Non-Vestibular symptoms: headaches and L arm tingly and "spots" in his vision  Type of dizziness: Lightheadedness/Faint  Frequency: every 2-3 days  Duration: ~20 mins  Aggravating factors: Induced by position change: supine to sit and sit to stand  Relieving factors: slow movements  Progression of symptoms: unchanged  OCULOMOTOR EXAM:  Ocular Alignment: normal  Ocular ROM: No Limitations  Spontaneous Nystagmus: absent  Gaze-Induced Nystagmus: absent  Smooth Pursuits: intact  Saccades: intact  Convergence/Divergence: <5 cm   VESTIBULAR - OCULAR REFLEX:   Slow VOR: Normal  VOR Cancellation: Normal  Head-Impulse Test: HIT Right: positive HIT Left: negative  POSITIONAL TESTING: not indicated based on symptom report  MOTION SENSITIVITY:  Motion Sensitivity Quotient Intensity: 0 = none, 1 = Lightheaded, 2 = Mild, 3 = Moderate, 4 = Severe, 5 = Vomiting  Intensity  1. Sitting to supine 0  2. Supine to L side 0  3. Supine to R side 0  4. Supine to sitting 1  5. L Hallpike-Dix   6. Up from L    7. R Hallpike-Dix   8. Up from R    9. Sitting, head  tipped to L knee "Blurry vision"  10. Head up from L knee "Blurry vision"  11. Sitting, head tipped to R knee 0  12. Head up from R knee "Blurry vision"  13. Sitting head turns x5 "Blurry vision"  14.Sitting head nods x5 "Blurry vision"  15. In stance, 180 turn to L    16. In stance, 180 turn to R      VESTIBULAR TREATMENT:                                                                                                   VOR x1 seated   PATIENT EDUCATION: Education details: PT POC, exam findings, VOR exercises Person educated: Patient Education method: Explanation and Handouts Education comprehension: verbalized understanding and needs further education  HOME EXERCISE PROGRAM: Gaze Stabilization: Sitting    Keeping eyes on target on wall 5 feet away, tilt head down 15-30 and move head side to side for _30___ seconds. Repeat while moving head up and down for __30__ seconds. Do __3__ sessions per day. GOALS: Goals reviewed with patient? Yes  SHORT TERM GOALS: = LTG based on PT POC  LONG TERM GOALS: Target date: 05/10/23  Pt will be independent with final HEP for improved symptom report Baseline: to be updated  Goal status: INITIAL  2.  Patient will improve FOTO score to >/= 56 to demonstrate improved symptom report Baseline: 43 Goal status: INITIAL   ASSESSMENT:  CLINICAL IMPRESSION: Patient is a 62 y.o. male who was seen today for physical therapy evaluation and treatment for dizziness. Patient reporting dizziness, feeling "lightheaded" with overt positional changes (supine > sit> stand), likely due to blood pressure fluctuations. He does incidentally have a positive R HIT, indicative of a R vestibular hypofunction. Provided patient with brief HEP of VOR exercises to address this. He would benefit from skilled PT services to address the above mentioned deficits.   OBJECTIVE IMPAIRMENTS: dizziness.   ACTIVITY LIMITATIONS: standing, bed mobility, locomotion level, and  caring for others  PARTICIPATION LIMITATIONS: shopping, community activity, and yard work  PERSONAL FACTORS: Age, Past/current experiences, Time since onset of injury/illness/exacerbation, Transportation, and 1 comorbidity: HTN  are also affecting patient's functional outcome.   REHAB POTENTIAL: Good  CLINICAL DECISION MAKING: Stable/uncomplicated  EVALUATION COMPLEXITY: Low   PLAN:  PT FREQUENCY: 1x/week  PT DURATION: 4 weeks  PLANNED INTERVENTIONS: Therapeutic exercises, Therapeutic activity, Neuromuscular re-education, Balance training, Gait training, Patient/Family education, Self Care, Joint mobilization, Vestibular training, Canalith repositioning, Visual/preceptual remediation/compensation, DME instructions, Aquatic Therapy, Manual therapy, and Re-evaluation  PLAN FOR NEXT SESSION:  progress VOR   Westley Foots, PT, DPT, CBIS 04/08/2023, 4:04 PM

## 2023-04-12 ENCOUNTER — Telehealth: Payer: Self-pay | Admitting: Family Medicine

## 2023-04-12 NOTE — Telephone Encounter (Signed)
Pt is calling to arrange transportation for PT appt on Thursday. Please advise CB- [phone]

## 2023-04-16 NOTE — Telephone Encounter (Signed)
Spoke with patient . Verified name & DOB   Patient advised that we only provide transportation for appointment that he has at this office. Patient voiced that he wasn't aware of this. Advised pt to call insurance as transportation is available with them and also advise to call out patient PT office they may be able to offer some assistance.

## 2023-04-18 ENCOUNTER — Ambulatory Visit: Payer: Medicaid Other | Admitting: Physical Therapy

## 2023-04-18 ENCOUNTER — Telehealth: Payer: Self-pay | Admitting: Physical Therapy

## 2023-04-18 NOTE — Telephone Encounter (Signed)
Called pt regarding No Show appt today. Pt reports that he called to cancel it last week due to not having transportation. Reminded pt of next appt with pt stating he will have transportation and will be there.   Sherlie Ban, PT, DPT 04/18/23 3:12 PM   [facility] [address] [place], [place]  [address] Phone:  [phone] Fax:  [phone]

## 2023-04-22 NOTE — Telephone Encounter (Signed)
Entered in error.  Sherlie Ban, PT, DPT

## 2023-04-23 ENCOUNTER — Encounter: Payer: Self-pay | Admitting: Family Medicine

## 2023-04-25 ENCOUNTER — Encounter: Payer: Self-pay | Admitting: Physical Therapy

## 2023-04-25 ENCOUNTER — Ambulatory Visit: Payer: Medicaid Other | Attending: Family Medicine | Admitting: Physical Therapy

## 2023-04-25 DIAGNOSIS — R42 Dizziness and giddiness: Secondary | ICD-10-CM | POA: Insufficient documentation

## 2023-04-25 DIAGNOSIS — R2681 Unsteadiness on feet: Secondary | ICD-10-CM | POA: Diagnosis present

## 2023-04-25 NOTE — Therapy (Signed)
OUTPATIENT PHYSICAL THERAPY VESTIBULAR TREATMENT NOTE     Patient Name: Christian Matthews MRN: [id number] DOB:12-25-1960, 62 y.o., male Today's Date: 04/25/2023  END OF SESSION:  PT End of Session - 04/25/23 1636     Visit Number 2    Number of Visits 5    Date for PT Re-Evaluation 05/10/23    Authorization Type [place] medicaid prepaid    PT Start Time 1535    PT Stop Time 1614    PT Time Calculation (min) 39 min    Activity Tolerance Patient tolerated treatment well    Behavior During Therapy [facility] for tasks assessed/performed              Past Medical History:  Diagnosis Date   Essential hypertension 03/30/2020   History reviewed. No pertinent surgical history. Patient Active Problem List   Diagnosis Date Noted   Atherosclerosis of aorta (HCC) 01/08/2022   Essential hypertension 03/30/2020   Smoking greater than 20 pack years 03/30/2020    PCP: Hoy Register, MD  REFERRING PROVIDER: Hoy Register, MD   REFERRING DIAG: R42 (ICD-10-CM) - Vertigo at   THERAPY DIAG:  Dizziness and giddiness  Unsteadiness on feet  ONSET DATE:   04/01/2023  referral  Rationale for Evaluation and Treatment: Rehabilitation  SUBJECTIVE:   SUBJECTIVE STATEMENT: Patient states he feels a little better - not having as much dizziness as he had at time of eval a couple of weeks ago; has been doing the letter exercise at home Pt accompanied by: self  PERTINENT HISTORY: HTN  PAIN:  Are you having pain? No -  More stiffness in low back and neck than pain  - states he occasionally feels like he has a cramp in his legs - can happen in either leg; pt denies cervical pain  There were no vitals filed for this visit.    PRECAUTIONS: None  WEIGHT BEARING RESTRICTIONS: No  FALLS: Has patient fallen in last 6 months? No  LIVING ENVIRONMENT: Lives with: is homeless  PLOF: Independent  PATIENT GOALS: "reduce pain"  OBJECTIVE:     GAIT: Gait pattern: WFL Distance walked:  clinic   THEREX:     Pt instructed in Mulligan's stretch for cervical rotators - pt performed 1 rep 30 sec hold to each side Lateral cervical flexor stretch - 1 rep 20 sec hold  AROM cervical flexion/extension 10 reps  Medbridge Access Code: [email or web address] URL: [email or web address] Date: 04/25/2023 Prepared by: Maebelle Munroe  Exercises - Seated Assisted Cervical Rotation with Towel  - 1 x daily - 7 x weekly - 1 sets - 1-2 reps - 20 secs hold - Seated Cervical Sidebending Stretch  - 1 x daily - 7 x weekly - 1 sets - 2 reps - 20 sec hold - Seated Cervical Flexion AROM  - 1 x daily - 7 x weekly - 1 sets - 5 reps - 3 sec hold - Standing Balance with Eyes Closed on Foam  - 1 x daily - 7 x weekly - 1 sets - 1-2 reps - 30 sec hold   NEURORE-ED:  Standing Balance: Surface: Pillows Position: Feet Hip Width Apart Completed with: Eyes Open and Eyes Closed; Head Turns x 5 Reps and Head Nods x 5 Reps   (Issued these exercises for HEP)   Pt amb. 35' x 1 rep with horizontal head turns; 35' x 1 rep with vertical head turns; pt had slight more unsteadiness with horizontal head turns than with vertical head turns    PATIENT  EDUCATION: Education details:  progressed x1 viewing to standing positon and increased to 60 secs both horizontal and vertical directions; Medbridge HEP - see above Person educated: Patient Education method: Explanation and Handouts Education comprehension: verbalized understanding and needs further education  HOME EXERCISE PROGRAM: Gaze Stabilization: Sitting    Keeping eyes on target on wall 5 feet away, tilt head down 15-30 and move head side to side for _30___ seconds. Repeat while moving head up and down for __30__ seconds. Do __3__ sessions per day. GOALS: Goals reviewed with patient? Yes  SHORT TERM GOALS: = LTG based on PT POC  LONG TERM GOALS: Target date: 05/10/23  Pt will be independent with final HEP for improved symptom report Baseline: to  be updated  Goal status: INITIAL  2.  Patient will improve FOTO score to >/= 56 to demonstrate improved symptom report Baseline: 43 Goal status: INITIAL   ASSESSMENT:  CLINICAL IMPRESSION: Patient's etiology of dizziness is unknown but does appear to have component of cervicogenic dizziness with pt reporting provocation of dizziness with Lt lateral cervical flexor stretch.  Pt also appears to have possible vestibular hypofunction as evidenced by mild unsteadiness with ambulation with head turns and also with standing on compliant surface with head turns and with EC.  Cont with POC.     OBJECTIVE IMPAIRMENTS: dizziness.   ACTIVITY LIMITATIONS: standing, bed mobility, locomotion level, and caring for others  PARTICIPATION LIMITATIONS: shopping, community activity, and yard work  PERSONAL FACTORS: Age, Past/current experiences, Time since onset of injury/illness/exacerbation, Transportation, and 1 comorbidity: HTN  are also affecting patient's functional outcome.   REHAB POTENTIAL: Good  CLINICAL DECISION MAKING: Stable/uncomplicated  EVALUATION COMPLEXITY: Low   PLAN:  PT FREQUENCY: 1x/week  PT DURATION: 4 weeks  PLANNED INTERVENTIONS: Therapeutic exercises, Therapeutic activity, Neuromuscular re-education, Balance training, Gait training, Patient/Family education, Self Care, Joint mobilization, Vestibular training, Canalith repositioning, Visual/preceptual remediation/compensation, DME instructions, Aquatic Therapy, Manual therapy, and Re-evaluation  PLAN FOR NEXT SESSION: check HEP for cervical stretches and balance on foam - progress as appropriate   Heather Streeper, Donavan Burnet, PT 04/25/2023, 4:39 PM

## 2023-05-02 ENCOUNTER — Encounter: Payer: Self-pay | Admitting: Physical Therapy

## 2023-05-02 ENCOUNTER — Ambulatory Visit: Payer: Medicaid Other | Admitting: Physical Therapy

## 2023-05-02 ENCOUNTER — Encounter: Payer: Self-pay | Admitting: Pharmacist

## 2023-05-02 ENCOUNTER — Other Ambulatory Visit: Payer: Self-pay

## 2023-05-02 ENCOUNTER — Ambulatory Visit: Payer: Medicaid Other | Attending: Family Medicine | Admitting: Pharmacist

## 2023-05-02 ENCOUNTER — Other Ambulatory Visit (HOSPITAL_COMMUNITY): Payer: Self-pay

## 2023-05-02 VITALS — BP 147/69 | HR 62

## 2023-05-02 VITALS — BP 141/81 | HR 73

## 2023-05-02 DIAGNOSIS — R42 Dizziness and giddiness: Secondary | ICD-10-CM

## 2023-05-02 DIAGNOSIS — R2681 Unsteadiness on feet: Secondary | ICD-10-CM

## 2023-05-02 DIAGNOSIS — I10 Essential (primary) hypertension: Secondary | ICD-10-CM | POA: Diagnosis not present

## 2023-05-02 NOTE — Therapy (Signed)
OUTPATIENT PHYSICAL THERAPY VESTIBULAR TREATMENT NOTE     Patient Name: Christian Matthews MRN: [id number] DOB:06-03-1961, 62 y.o., male Today's Date: 05/02/2023  END OF SESSION:  PT End of Session - 05/02/23 1535     Visit Number 3    Number of Visits 5    Date for PT Re-Evaluation 05/10/23    Authorization Type [place] medicaid prepaid    PT Start Time 1533    PT Stop Time 1616    PT Time Calculation (min) 43 min    Equipment Utilized During Treatment Gait belt    Activity Tolerance Patient tolerated treatment well    Behavior During Therapy WFL for tasks assessed/performed             Past Medical History:  Diagnosis Date   Essential hypertension 03/30/2020   History reviewed. No pertinent surgical history. Patient Active Problem List   Diagnosis Date Noted   Atherosclerosis of aorta (HCC) 01/08/2022   Essential hypertension 03/30/2020   Smoking greater than 20 pack years 03/30/2020    PCP: Hoy Register, MD  REFERRING PROVIDER: Hoy Register, MD   REFERRING DIAG: R42 (ICD-10-CM) - Vertigo at   THERAPY DIAG:  Dizziness and giddiness  Unsteadiness on feet  ONSET DATE:   04/01/2023  referral  Rationale for Evaluation and Treatment: Rehabilitation  SUBJECTIVE:   SUBJECTIVE STATEMENT: Patient states his dizziness hasn't been too bad, stating that "I can't complain."   Pt accompanied by: self  PERTINENT HISTORY: HTN  PAIN:  Are you having pain?  Yes: NPRS scale: moderate amount/10 Pain location: low back R side Pain description: achy Aggravating factors: unsure Relieving factors: unsure    PRECAUTIONS: None  WEIGHT BEARING RESTRICTIONS: No  FALLS: Has patient fallen in last 6 months? No  LIVING ENVIRONMENT: Lives with: is homeless  PLOF: Independent  PATIENT GOALS: "reduce pain"  OBJECTIVE:   Vitals:   05/02/23 1538  BP: (!) 141/81  Pulse: 73   TherEx: Sidebending cervical stretch 2 x 30" bilaterally Levator scapulae stretch 2 x 30"  bilaterally Chin tucks x 10 with 5" hold   NMR: VOR x 1 viewing with horizontal/vertical head turns standing with plain background (denies any dizziness, states he sometimes gets mild dizziness when performing at home, provided manual feedback to prevent compensatory head turns with horizontal head movements) VOR x 2 view attempted 1 x 30" horizontal, difficulty coordinating movements (held for HEP) Between // bars with CGA NBOS with EO with horizontal head turns 2 x 10 NBOS with EO with vertical head turns 2 x 10 NBOS with EC with vertical head turns 2 x 10 NBOS with EC with vertical head turns 2 x 10 Education on keeping head in neutral with head turns or slight flexion, patient compensates with figure 8 movement until corrected, provided max verbal, visual, and tactile cues  FOTO: Improved to 53  PATIENT EDUCATION: Education details:  continue HEP, progress on FOTO, vertical/horizontal head turns Person educated: Patient Education method: Explanation and Handouts Education comprehension: verbalized understanding and needs further education  HOME EXERCISE PROGRAM: Gaze Stabilization: Sitting    Keeping eyes on target on wall 5 feet away, tilt head down 15-30 and move head side to side for _30___ seconds. Repeat while moving head up and down for __30__ seconds. Do __3__ sessions per day.  Medbridge Access Code: [email or web address] URL: [email or web address] Date: 04/25/2023 Prepared by: Maebelle Munroe  Exercises - Seated Assisted Cervical Rotation with Towel  - 1 x daily - 7 x weekly -  1 sets - 1-2 reps - 20 secs hold - Seated Cervical Sidebending Stretch  - 1 x daily - 7 x weekly - 1 sets - 2 reps - 20 sec hold - Seated Cervical Flexion AROM  - 1 x daily - 7 x weekly - 1 sets - 5 reps - 3 sec hold - Standing Balance with Eyes Closed on Foam  - 1 x daily - 7 x weekly - 1 sets - 1-2 reps - 30 sec hold   GOALS: Goals reviewed with patient? Yes  SHORT TERM GOALS: = LTG  based on PT POC  LONG TERM GOALS: Target date: 05/10/23  Pt will be independent with final HEP for improved symptom report Baseline: to be updated  Goal status: INITIAL  2.  Patient will improve FOTO score to >/= 56 to demonstrate improved symptom report Baseline: 43; improved to 53 Goal status: IN PROGRESS   ASSESSMENT:  CLINICAL IMPRESSION: Session focused on continued work on cervicogenic component in addition to balance training. Patient only complains of imbalance and has no complaints of dizziness during today's session. Patient would like to plan for possible D/C next session with updated HEP due to satisfaction with progress thus far. Addressed compensatory figure 8 head turns. Session emphasized work on roCont with POC.     OBJECTIVE IMPAIRMENTS: dizziness.   ACTIVITY LIMITATIONS: standing, bed mobility, locomotion level, and caring for others  PARTICIPATION LIMITATIONS: shopping, community activity, and yard work  PERSONAL FACTORS: Age, Past/current experiences, Time since onset of injury/illness/exacerbation, Transportation, and 1 comorbidity: HTN  are also affecting patient's functional outcome.   REHAB POTENTIAL: Good  CLINICAL DECISION MAKING: Stable/uncomplicated  EVALUATION COMPLEXITY: Low   PLAN:  PT FREQUENCY: 1x/week  PT DURATION: 4 weeks  PLANNED INTERVENTIONS: Therapeutic exercises, Therapeutic activity, Neuromuscular re-education, Balance training, Gait training, Patient/Family education, Self Care, Joint mobilization, Vestibular training, Canalith repositioning, Visual/preceptual remediation/compensation, DME instructions, Aquatic Therapy, Manual therapy, and Re-evaluation  PLAN FOR NEXT SESSION: possible D/C next session, review goals, create balance HEP   Carmelia Bake, PT, DPT 05/02/2023, 4:43 PM

## 2023-05-02 NOTE — Progress Notes (Signed)
S:  No chief complaint on file.  62 y.o. male who presents for hypertension evaluation, education, and management.  PMH is significant for HTN.  Patient was referred and last seen by Primary Care Provider, Dr. Hoy Register, on 04/01/2023. At that visit, BP 177/75. Patient was given hydralazine 50 mg in clinic. Patient was last seen by pharmacy clinic over a year ago. We have followed him before with BP control depending on adherence. He has a hx of poor medication adherence and his BP will often be high in clinic due to this.  Today, patient arrives in positive spirits and presents without assistance. Hypertension diagnosed in 2021. Patient reports taking all three anti-hypertensive medications today, however, fill history shows losartan-HCTZ was last picked up in February. He reports he does not have a BP cuff at home to check but he will borrow a cuff from his family member. Denies headache, blurred vision, swelling. BP in clinic 168/73, couple minutes later 147/69.  Family/Social history:  Tobacco: current smoker  Current antihypertensives include: losartan-HCTZ 100-25 mg daily, amlodipine 10 mg daily, carvedilol 12.5 mg BID  Antihypertensives tried in the past include: none  O:  Vitals:   05/02/23 1142  BP: (!) 147/69  Pulse: 62   Last 3 Office BP readings: BP Readings from Last 3 Encounters:  04/08/23 (!) 167/87  04/01/23 (!) 177/75  01/01/23 133/80    BMET    Component Value Date/Time   NA 140 09/25/2022 1135   K 4.3 09/25/2022 1135   CL 100 09/25/2022 1135   CO2 26 09/25/2022 1135   GLUCOSE 91 09/25/2022 1135   GLUCOSE 87 01/30/2020 1934   BUN 7 (L) 09/25/2022 1135   CREATININE 0.89 09/25/2022 1135   CALCIUM 10.0 09/25/2022 1135   GFRNONAA 94 05/03/2020 1510   GFRAA 109 05/03/2020 1510    Renal function: CrCl cannot be calculated (Patient's most recent lab result is older than the maximum 21 days allowed.).  Clinical ASCVD: No  The 10-year ASCVD risk  score (Arnett DK, et al., 2019) is: 32.9%   Values used to calculate the score:     Age: 71 years     Sex: Male     Is Non-Hispanic African American: Yes     Diabetic: No     Tobacco smoker: Yes     Systolic Blood Pressure: 167 mmHg     Is BP treated: Yes     HDL Cholesterol: 86 mg/dL     Total Cholesterol: 180 mg/dL  Patient is participating in a Managed Medicaid Plan: No    A/P: Hypertension longstanding, uncontrolled on current medications. BP goal < 130/80 mmHg. Control is suboptimal due to poor medication adherence. Provided patient with a refill of losartan-HCTZ to pick up at the pharmacy. -Continued losartan-HCTZ 100-25 mg daily, amlodipine 10 mg daily, carvedilol 12.5 mg BID.  -Patient educated on purpose, proper use, and potential adverse effects.  -Counseled on lifestyle modifications for blood pressure control including reduced dietary sodium, increased exercise, adequate sleep. -Encouraged patient to check BP at home and bring log of readings to next visit. Counseled on proper use of home BP cuff.   Results reviewed and written information provided.    Written patient instructions provided. Patient verbalized understanding of treatment plan.  Total time in face to face counseling 30 minutes.    Follow-up:  Pharmacist in 1 month.   Patient seen with  Alesia Banda, PharmD Candidate [employer] [address] [place],  PharmD, Patsy Baltimore, CPP Clinical Pharmacist [facility] [phone]

## 2023-05-03 ENCOUNTER — Ambulatory Visit
Admission: RE | Admit: 2023-05-03 | Discharge: 2023-05-03 | Disposition: A | Discharge status: Home / Self Care | Payer: Medicaid Other | Source: Ambulatory Visit | Attending: Family Medicine | Admitting: Family Medicine

## 2023-05-03 DIAGNOSIS — F17210 Nicotine dependence, cigarettes, uncomplicated: Secondary | ICD-10-CM

## 2023-05-09 ENCOUNTER — Other Ambulatory Visit: Payer: Self-pay | Admitting: Family Medicine

## 2023-05-09 ENCOUNTER — Encounter: Payer: Self-pay | Admitting: Physical Therapy

## 2023-05-09 ENCOUNTER — Ambulatory Visit: Payer: Medicaid Other | Admitting: Physical Therapy

## 2023-05-09 VITALS — BP 139/72 | HR 75

## 2023-05-09 DIAGNOSIS — R42 Dizziness and giddiness: Secondary | ICD-10-CM

## 2023-05-09 DIAGNOSIS — I2510 Atherosclerotic heart disease of native coronary artery without angina pectoris: Secondary | ICD-10-CM

## 2023-05-09 DIAGNOSIS — R2681 Unsteadiness on feet: Secondary | ICD-10-CM

## 2023-05-09 NOTE — Therapy (Signed)
OUTPATIENT PHYSICAL THERAPY VESTIBULAR TREATMENT/DISCHARGE NOTE    PHYSICAL THERAPY DISCHARGE SUMMARY  Visits from Start of Care: 4  Current functional level related to goals / functional outcomes: Achieved all LTGs   Remaining deficits: None noted at this time   Education / Equipment: Return to PT if noticed decline in function with new referral, continue HEP   Patient agrees to discharge. Patient goals were met. Patient is being discharged due to meeting the stated rehab goals.  Patient Name: Christian Matthews MRN: [id number] DOB:02-17-1961, 62 y.o., male Today's Date: 05/09/2023  END OF SESSION:  PT End of Session - 05/09/23 1447     Visit Number 4    Number of Visits 5    Date for PT Re-Evaluation 05/10/23    Authorization Type [place] medicaid prepaid    PT Start Time 1446    PT Stop Time 1525    PT Time Calculation (min) 39 min    Equipment Utilized During Treatment Gait belt    Activity Tolerance Patient tolerated treatment well    Behavior During Therapy WFL for tasks assessed/performed             Past Medical History:  Diagnosis Date   Essential hypertension 03/30/2020   History reviewed. No pertinent surgical history. Patient Active Problem List   Diagnosis Date Noted   Atherosclerosis of aorta (HCC) 01/08/2022   Essential hypertension 03/30/2020   Smoking greater than 20 pack years 03/30/2020    PCP: Hoy Register, MD  REFERRING PROVIDER: Hoy Register, MD   REFERRING DIAG: R42 (ICD-10-CM) - Vertigo at   THERAPY DIAG:  Dizziness and giddiness  Unsteadiness on feet  ONSET DATE:   04/01/2023  referral  Rationale for Evaluation and Treatment: Rehabilitation  SUBJECTIVE:   SUBJECTIVE STATEMENT: Patient reports that he has had no new episodes of dizziness. Denies falls/near falls. Patient is ready for discharge. Patient to follow up with cardiology.   Pt accompanied by: self  PERTINENT HISTORY: HTN  PAIN:  Are you having pain?  Yes:  NPRS scale: moderate amount/10 Pain location: low back R side Pain description: achy Aggravating factors: unsure Relieving factors: unsure    PRECAUTIONS: None  WEIGHT BEARING RESTRICTIONS: No  FALLS: Has patient fallen in last 6 months? No  LIVING ENVIRONMENT: Lives with: is homeless  PLOF: Independent  PATIENT GOALS: "reduce pain"  OBJECTIVE:   Vitals:   05/09/23 1451  BP: 139/72  Pulse: 75    NMR:   FOTO: Improved to 61  Patient reports wanting to use the remainder of session to work on balance.   Obstacle course navigation: Compliant surface on foam, blue pad, 12 inch hurdle step overs, dina disc steps 4 x 10' fwd (CGA) 4 x 10' fwd with horizontal head turns (CGA-minA) 4 x 10' fwd with vertical head turns (CGA-minA) 4 x 10' fwd with puple ball bounce to self (CGA-minA) Standing on dina discs with step taps to double blue foam 2 x 10 Standing on blue foam with step downs to double dina discs 2 x 10 with lateral reach out  PATIENT EDUCATION: Education details:  Continue HEP + when to return to PT Person educated: Patient Education method: Explanation Education comprehension: verbalized understanding and returned demonstration  HOME EXERCISE PROGRAM: Gaze Stabilization: Sitting    Keeping eyes on target on wall 5 feet away, tilt head down 15-30 and move head side to side for _30___ seconds. Repeat while moving head up and down for __30__ seconds. Do __3__ sessions per  day.  Medbridge Access Code: [email or web address] URL: [email or web address] Date: 04/25/2023 Prepared by: Maebelle Munroe  Exercises - Seated Assisted Cervical Rotation with Towel  - 1 x daily - 7 x weekly - 1 sets - 1-2 reps - 20 secs hold - Seated Cervical Sidebending Stretch  - 1 x daily - 7 x weekly - 1 sets - 2 reps - 20 sec hold - Seated Cervical Flexion AROM  - 1 x daily - 7 x weekly - 1 sets - 5 reps - 3 sec hold - Standing Balance with Eyes Closed on Foam  - 1 x daily - 7 x  weekly - 1 sets - 1-2 reps - 30 sec hold   GOALS: Goals reviewed with patient? Yes  SHORT TERM GOALS: = LTG based on PT POC  LONG TERM GOALS: Target date: 05/10/23  Pt will be independent with final HEP for improved symptom report Baseline: to be updated; reports confidence in final HEP Goal status: MET  2.  Patient will improve FOTO score to >/= 56 to demonstrate improved symptom report Baseline: 43; improved to 53; improved to 61 Goal status: MET   ASSESSMENT:  CLINICAL IMPRESSION: Patient is discharging from PT. No new episodes of dizziness, confident in final HEP, and patient improved above predicted on FOTO. All goals met; no further services indicated.   OBJECTIVE IMPAIRMENTS: dizziness.   ACTIVITY LIMITATIONS: standing, bed mobility, locomotion level, and caring for others  PARTICIPATION LIMITATIONS: shopping, community activity, and yard work  PERSONAL FACTORS: Age, Past/current experiences, Time since onset of injury/illness/exacerbation, Transportation, and 1 comorbidity: HTN  are also affecting patient's functional outcome.   REHAB POTENTIAL: Good  CLINICAL DECISION MAKING: Stable/uncomplicated  EVALUATION COMPLEXITY: Low   PLAN:  PT FREQUENCY: 1x/week  PT DURATION: 4 weeks  PLANNED INTERVENTIONS: Therapeutic exercises, Therapeutic activity, Neuromuscular re-education, Balance training, Gait training, Patient/Family education, Self Care, Joint mobilization, Vestibular training, Canalith repositioning, Visual/preceptual remediation/compensation, DME instructions, Aquatic Therapy, Manual therapy, and Re-evaluation  PLAN FOR NEXT SESSION: NA- Patient discharged with updated HEP   Carmelia Bake, PT, DPT 05/09/2023, 5:47 PM

## 2023-06-03 ENCOUNTER — Encounter: Payer: Self-pay | Admitting: Pharmacist

## 2023-06-03 ENCOUNTER — Ambulatory Visit: Payer: Medicaid Other | Attending: Family Medicine | Admitting: Pharmacist

## 2023-06-03 VITALS — BP 120/70 | HR 71

## 2023-06-03 DIAGNOSIS — I10 Essential (primary) hypertension: Secondary | ICD-10-CM

## 2023-06-03 NOTE — Progress Notes (Signed)
   S:  No chief complaint on file.  62 y.o. male who presents for hypertension evaluation, education, and management.  PMH is significant for HTN.   Patient was referred and last seen by Primary Care Provider, Dr. Hoy Register, on 04/01/2023. At that visit, BP 177/75. Pharmacy saw him subsequently on 05/02/2023 and BP was 147/69 mmHg. He has some problems with medication access and adherence.  Today, patient arrives in positive spirits and presents without assistance. His fill histories indicate that his BP medications have been filled regularly since his last visit. Patient reports taking all three anti-hypertensive medications this morning prior to this visit. He reports he does not have a BP cuff at home to check but he will borrow a cuff from his family member. Denies headache, blurred vision, swelling.   Family/Social history:  Tobacco: current smoker  Current antihypertensives include: losartan-HCTZ 100-25 mg daily, amlodipine 10 mg daily, carvedilol 12.5 mg BID  Antihypertensives tried in the past include: none  O:  Vitals:   06/03/23 1016  BP: 120/70  Pulse: 71    Last 3 Office BP readings: BP Readings from Last 3 Encounters:  06/03/23 120/70  05/09/23 139/72  05/02/23 (!) 141/81    BMET    Component Value Date/Time   NA 140 09/25/2022 1135   K 4.3 09/25/2022 1135   CL 100 09/25/2022 1135   CO2 26 09/25/2022 1135   GLUCOSE 91 09/25/2022 1135   GLUCOSE 87 01/30/2020 1934   BUN 7 (L) 09/25/2022 1135   CREATININE 0.89 09/25/2022 1135   CALCIUM 10.0 09/25/2022 1135   GFRNONAA 94 05/03/2020 1510   GFRAA 109 05/03/2020 1510    Renal function: CrCl cannot be calculated (Patient's most recent lab result is older than the maximum 21 days allowed.).  Clinical ASCVD: No  The 10-year ASCVD risk score (Arnett DK, et al., 2019) is: 19.1%   Values used to calculate the score:     Age: 59 years     Sex: Male     Is Non-Hispanic African American: Yes     Diabetic:  No     Tobacco smoker: Yes     Systolic Blood Pressure: 120 mmHg     Is BP treated: Yes     HDL Cholesterol: 86 mg/dL     Total Cholesterol: 180 mg/dL  Patient is participating in a Managed Medicaid Plan: No    A/P: Hypertension longstanding, currently at goal on current medications. BP goal < 130/80 mmHg. Control is optimal with improved medication adherence. After his visit with me last month, he had a subsequent visit with OP rehab with a BP close to goal. Today's BP is excellent after allowing patient ~5 minutes to rest prior to taking.  -Continued losartan-HCTZ 100-25 mg daily, amlodipine 10 mg daily, carvedilol 12.5 mg BID.  -Patient educated on purpose, proper use, and potential adverse effects.  -Counseled on lifestyle modifications for blood pressure control including reduced dietary sodium, increased exercise, adequate sleep. -Encouraged patient to check BP at home and bring log of readings to next visit. Counseled on proper use of home BP cuff.   Results reviewed and written information provided.    Written patient instructions provided. Patient verbalized understanding of treatment plan.  Total time in face to face counseling 30 minutes.    Follow-up:  With PCP next month.   Butch Penny, PharmD, Patsy Baltimore, CPP Clinical Pharmacist [facility] [phone]

## 2023-07-03 ENCOUNTER — Ambulatory Visit: Payer: Self-pay | Admitting: Family Medicine

## 2023-09-23 ENCOUNTER — Other Ambulatory Visit: Payer: Self-pay | Admitting: Family Medicine

## 2023-09-23 DIAGNOSIS — I700 Atherosclerosis of aorta: Secondary | ICD-10-CM

## 2023-09-24 ENCOUNTER — Other Ambulatory Visit: Payer: Self-pay

## 2023-09-24 ENCOUNTER — Encounter: Payer: Self-pay | Admitting: Family Medicine

## 2023-09-24 ENCOUNTER — Ambulatory Visit: Payer: Medicaid Other | Attending: Family Medicine | Admitting: Family Medicine

## 2023-09-24 VITALS — BP 148/80 | HR 78 | Ht 65.0 in | Wt 113.8 lb

## 2023-09-24 DIAGNOSIS — I10 Essential (primary) hypertension: Secondary | ICD-10-CM | POA: Diagnosis not present

## 2023-09-24 DIAGNOSIS — R42 Dizziness and giddiness: Secondary | ICD-10-CM | POA: Diagnosis not present

## 2023-09-24 DIAGNOSIS — I700 Atherosclerosis of aorta: Secondary | ICD-10-CM | POA: Diagnosis not present

## 2023-09-24 DIAGNOSIS — Z131 Encounter for screening for diabetes mellitus: Secondary | ICD-10-CM | POA: Diagnosis not present

## 2023-09-24 DIAGNOSIS — M25522 Pain in left elbow: Secondary | ICD-10-CM | POA: Diagnosis not present

## 2023-09-24 DIAGNOSIS — Z1211 Encounter for screening for malignant neoplasm of colon: Secondary | ICD-10-CM | POA: Diagnosis not present

## 2023-09-24 DIAGNOSIS — Z13228 Encounter for screening for other metabolic disorders: Secondary | ICD-10-CM | POA: Diagnosis not present

## 2023-09-24 MED ORDER — CARVEDILOL 12.5 MG PO TABS
12.5000 mg | ORAL_TABLET | Freq: Two times a day (BID) | ORAL | 1 refills | Status: DC
  Filled 2023-12-25: qty 60, 30d supply, fill #0

## 2023-09-24 MED ORDER — DICLOFENAC SODIUM 1 % EX GEL
4.0000 g | Freq: Four times a day (QID) | CUTANEOUS | 1 refills | Status: DC
  Filled 2023-09-24: qty 100, 8d supply, fill #0

## 2023-09-24 MED ORDER — AMLODIPINE BESYLATE 10 MG PO TABS
10.0000 mg | ORAL_TABLET | Freq: Every day | ORAL | 1 refills | Status: DC
  Filled 2023-12-25: qty 30, 30d supply, fill #0
  Filled 2024-06-17: qty 30, 30d supply, fill #1

## 2023-09-24 MED ORDER — LOSARTAN POTASSIUM-HCTZ 100-25 MG PO TABS
1.0000 | ORAL_TABLET | Freq: Every day | ORAL | 1 refills | Status: DC
  Filled 2023-09-24: qty 90, 90d supply, fill #0
  Filled 2023-12-25: qty 30, 30d supply, fill #1

## 2023-09-24 MED ORDER — ATORVASTATIN CALCIUM 20 MG PO TABS
20.0000 mg | ORAL_TABLET | Freq: Every day | ORAL | 1 refills | Status: DC
  Filled 2023-09-24: qty 90, 90d supply, fill #0
  Filled 2023-12-25: qty 30, 30d supply, fill #1

## 2023-09-24 NOTE — Patient Instructions (Signed)
VISIT SUMMARY:  During today's visit, we discussed your ongoing health concerns, including hypertension, vertigo, elbow pain, and smoking. We also reviewed your medication adherence and the need for routine health maintenance.  YOUR PLAN:  -HYPERTENSION: Hypertension means high blood pressure. Despite taking your current medications (amlodipine, carvedilol, and losartan), your blood pressure remains high. We will increase the dose of losartan hydrochlorothiazide and recheck your blood pressure at your next visit.  -ELBOW PAIN: Your left elbow pain and swelling are likely due to olecranon bursitis, which is inflammation of the fluid-filled sac in the elbow. We have prescribed Voltaren gel to reduce the inflammation and pain.  -HYPERLIPIDEMIA: Hyperlipidemia means high cholesterol levels in the blood. You have not been taking your prescribed atorvastatin. We have resent the prescription to the pharmacy and encourage you to take it as directed.  -COLON CANCER SCREENING: You are due for a colonoscopy, which is a screening test for colon cancer. We have resent the referral for this procedure.  -SMOKING: You are currently smoking 1-2 cigarettes daily but have expressed a desire to quit. We encourage you to continue your efforts to stop smoking.  -GENERAL HEALTH MAINTENANCE: We have ordered blood work to check your kidney and liver function, as it has been a year since your last labs. Routine blood tests help [place] monitor your overall health.  INSTRUCTIONS:  Please follow up for a blood pressure check at your next visit. Additionally, ensure you complete the colonoscopy as referred and adhere to your atorvastatin medication. We will review your blood work results once they are available.

## 2023-09-24 NOTE — Progress Notes (Signed)
Subjective:  Patient ID: Christian Matthews, male    DOB: 06/01/1961  Age: 62 y.o. MRN: [id number]  CC: Medical Management of Chronic Issues (Left arm weakness)   HPI Dadrian Ballantine is a 62 y.o. year old male with a history of hypertension, vertigo, tobacco abuse who presents today for chronic disease management.      Interval History: Discussed the use of AI scribe software for clinical note transcription with the patient, who gave verbal consent to proceed.   The patient, with a history of hypertension and vertigo, reports improvement in vertigo symptoms after physical therapy and continues to perform recommended exercises. Despite adherence to antihypertensive medications (amlodipine, carvedilol, and losartan), his blood pressure remains elevated.  He also reports left elbow pain and weakness, with difficulty pushing or lifting objects. The pain is associated with mild swelling and has been present for approximately one week. The patient has not been taking his prescribed atorvastatin, as he does not believe he has high cholesterol. He continues to smoke one to two cigarettes daily, but is cutting back slowly but not yet ready to quit.       Past Medical History:  Diagnosis Date   Essential hypertension 03/30/2020    No past surgical history on file.  No family history on file.  Social History   Socioeconomic History   Marital status: Single    Spouse name: Not on file   Number of children: Not on file   Years of education: Not on file   Highest education level: Not on file  Occupational History   Not on file  Tobacco Use   Smoking status: Every Day   Smokeless tobacco: Never  Substance and Sexual Activity   Alcohol use: Yes   Drug use: Not on file   Sexual activity: Not on file  Other Topics Concern   Not on file  Social History Narrative   Not on file   Social Determinants of Health   Financial Resource Strain: Not on file  Food Insecurity: Not on file   Transportation Needs: Unmet Transportation Needs (12/31/2022)   PRAPARE - Administrator, Civil Service (Medical): Yes    Lack of Transportation (Non-Medical): Not on file  Physical Activity: Not on file  Stress: Not on file  Social Connections: Not on file    No Known Allergies  Outpatient Medications Prior to Visit  Medication Sig Dispense Refill   meclizine (ANTIVERT) 25 MG tablet Take 1 tablet (25 mg total) by mouth 3 (three) times daily as needed for dizziness. 60 tablet 1   amLODipine (NORVASC) 10 MG tablet Take 1 tablet (10 mg total) by mouth daily. 90 tablet 1   carvedilol (COREG) 12.5 MG tablet Take 1 tablet (12.5 mg total) by mouth 2 (two) times daily with a meal. 180 tablet 1   losartan-hydrochlorothiazide (HYZAAR) 100-25 MG tablet TAKE 1 TABLET BY MOUTH DAILY. 90 tablet 1   Multiple Vitamin (MULTIVITAMIN WITH MINERALS) TABS tablet Take 1 tablet by mouth daily. (Patient not taking: Reported on 04/01/2023) 30 tablet 0   nicotine (NICODERM CQ) 7 mg/24hr patch Place 1 patch (7 mg total) onto the skin daily. (Patient not taking: Reported on 12/28/2021) 28 patch 1   thiamine 100 MG tablet Take 1 tablet (100 mg total) by mouth daily. (Patient not taking: Reported on 04/01/2023) 30 tablet 0   atorvastatin (LIPITOR) 20 MG tablet Take 1 tablet (20 mg total) by mouth daily. (Patient not taking: Reported on 04/01/2023) 90 tablet  1   No facility-administered medications prior to visit.     ROS Review of Systems  Constitutional:  Negative for activity change and appetite change.  HENT:  Negative for sinus pressure and sore throat.   Respiratory:  Negative for chest tightness, shortness of breath and wheezing.   Cardiovascular:  Negative for chest pain and palpitations.  Gastrointestinal:  Negative for abdominal distention, abdominal pain and constipation.  Genitourinary: Negative.   Musculoskeletal:        See HPI  Psychiatric/Behavioral:  Negative for behavioral problems and  dysphoric mood.     Objective:  BP (!) 148/80   Pulse 78   Ht 5\' 5"  (1.651 m)   Wt 113 lb 12.8 oz (51.6 kg)   SpO2 100%   BMI 18.94 kg/m      09/24/2023    1:54 PM 09/24/2023    1:37 PM 06/03/2023   10:16 AM  BP/Weight  Systolic BP 148 165 120  Diastolic BP 80 76 70  Wt. (Lbs)  113.8   BMI  18.94 kg/m2       Physical Exam Constitutional:      Appearance: He is well-developed.  Cardiovascular:     Rate and Rhythm: Normal rate.     Heart sounds: Normal heart sounds. No murmur heard. Pulmonary:     Effort: Pulmonary effort is normal.     Breath sounds: Normal breath sounds. No wheezing or rales.  Chest:     Chest wall: No tenderness.  Abdominal:     General: Bowel sounds are normal. There is no distension.     Palpations: Abdomen is soft. There is no mass.     Tenderness: There is no abdominal tenderness.  Musculoskeletal:        General: Normal range of motion.     Right lower leg: No edema.     Left lower leg: No edema.     Comments: No edema of left elbow Tenderness to palpation of the left olecranon process Right elbow is normal  Neurological:     Mental Status: He is alert and oriented to person, place, and time.  Psychiatric:        Mood and Affect: Mood normal.        Latest Ref Rng & Units 09/25/2022   11:35 AM 02/05/2022    4:33 PM 03/15/2021   10:25 AM  CMP  Glucose 70 - 99 mg/dL 91  85  82   BUN 8 - 27 mg/dL 7  15  11    Creatinine 0.76 - 1.27 mg/dL 1.61  0.96  0.45   Sodium 134 - 144 mmol/L 140  138  142   Potassium 3.5 - 5.2 mmol/L 4.3  3.9  3.7   Chloride 96 - 106 mmol/L 100  99  100   CO2 20 - 29 mmol/L 26  25  24    Calcium 8.6 - 10.2 mg/dL 40.9  81.1  91.4   Total Protein 6.0 - 8.5 g/dL 8.3  7.7    Total Bilirubin 0.0 - 1.2 mg/dL 0.4  0.6    Alkaline Phos 44 - 121 IU/L 100  95    AST 0 - 40 IU/L 33  31    ALT 0 - 44 IU/L 21  29      Lipid Panel     Component Value Date/Time   CHOL 180 09/25/2022 1135   TRIG 67 09/25/2022 1135    HDL 86 09/25/2022 1135   CHOLHDL 2.5 02/07/2021 1109  LDLCALC 81 09/25/2022 1135    CBC    Component Value Date/Time   WBC 3.0 (L) 09/25/2022 1135   WBC 3.5 (L) 01/30/2020 1934   RBC 4.11 (L) 09/25/2022 1135   RBC 4.53 01/30/2020 1934   HGB 12.7 (L) 09/25/2022 1135   HCT 37.9 09/25/2022 1135   PLT 240 09/25/2022 1135   MCV 92 09/25/2022 1135   MCH 30.9 09/25/2022 1135   MCH 31.6 01/30/2020 1934   MCHC 33.5 09/25/2022 1135   MCHC 34.5 01/30/2020 1934   RDW 13.2 09/25/2022 1135   LYMPHSABS 0.9 09/25/2022 1135   EOSABS 0.1 09/25/2022 1135   BASOSABS 0.0 09/25/2022 1135    No results found for: "HGBA1C"   The 10-year ASCVD risk score (Arnett DK, et al., 2019) is: 27.1%   Values used to calculate the score:     Age: 32 years     Sex: Male     Is Non-Hispanic African American: Yes     Diabetic: No     Tobacco smoker: Yes     Systolic Blood Pressure: 148 mmHg     Is BP treated: Yes     HDL Cholesterol: 86 mg/dL     Total Cholesterol: 180 mg/dL  Assessment & Plan:      Hypertension Elevated blood pressure today despite reported adherence to current regimen (amlodipine, carvedilol, losartan).  Repeat blood pressure reveals improvement I will make no regimen changes today as his blood pressure was controlled at his last visit with the clinical pharmacist Counseled on blood pressure goal of less than 130/80, low-sodium, DASH diet, medication compliance, 150 minutes of moderate intensity exercise per week. Discussed medication compliance, adverse effects.  Elbow Pain New onset left elbow pain with some swelling of the olecranon process -Prescribe Voltaren gel for local application to reduce inflammation and pain.  Increased cardiovascular risk Patient reported non-adherence to atorvastatin. -Resend atorvastatin prescription to pharmacy and encourage adherence.  Colon Cancer Screening Patient due for colonoscopy, previous referral not followed up on. -Resend referral  for colonoscopy.  Smoking Patient reports smoking 1-2 cigarettes daily, showing some intention to quit. -Encourage continued efforts to quit smoking.  General Health Maintenance -Order blood work to check kidney and liver function, as it has been a year since last labs. -Follow-up after results are available.          Meds ordered this encounter  Medications   amLODipine (NORVASC) 10 MG tablet    Sig: Take 1 tablet (10 mg total) by mouth daily.    Dispense:  90 tablet    Refill:  1   atorvastatin (LIPITOR) 20 MG tablet    Sig: Take 1 tablet (20 mg total) by mouth daily.    Dispense:  90 tablet    Refill:  1   carvedilol (COREG) 12.5 MG tablet    Sig: Take 1 tablet (12.5 mg total) by mouth 2 (two) times daily with a meal.    Dispense:  180 tablet    Refill:  1   diclofenac Sodium (VOLTAREN) 1 % GEL    Sig: Apply 4 g topically 4 (four) times daily.    Dispense:  100 g    Refill:  1   losartan-hydrochlorothiazide (HYZAAR) 100-25 MG tablet    Sig: TAKE 1 TABLET BY MOUTH DAILY.    Dispense:  90 tablet    Refill:  1    Follow-up: Return in about 3 months (around 12/25/2023) for Chronic medical conditions.  Hoy Register, MD, FAAFP. [facility] [place], [place] [phone]   09/24/2023, 1:58 PM

## 2023-09-25 LAB — CMP14+EGFR
ALT: 18 [IU]/L (ref 0–44)
AST: 23 [IU]/L (ref 0–40)
Albumin: 4.7 g/dL (ref 3.9–4.9)
Alkaline Phosphatase: 109 [IU]/L (ref 44–121)
BUN/Creatinine Ratio: 20 (ref 10–24)
BUN: 19 mg/dL (ref 8–27)
Bilirubin Total: 0.4 mg/dL (ref 0.0–1.2)
CO2: 26 mmol/L (ref 20–29)
Calcium: 10.1 mg/dL (ref 8.6–10.2)
Chloride: 100 mmol/L (ref 96–106)
Creatinine, Ser: 0.97 mg/dL (ref 0.76–1.27)
Globulin, Total: 3.2 g/dL (ref 1.5–4.5)
Glucose: 87 mg/dL (ref 70–99)
Potassium: 4.1 mmol/L (ref 3.5–5.2)
Sodium: 140 mmol/L (ref 134–144)
Total Protein: 7.9 g/dL (ref 6.0–8.5)
eGFR: 87 mL/min/{1.73_m2} (ref >59)

## 2023-09-25 LAB — HEMOGLOBIN A1C
Est. average glucose Bld gHb Est-mCnc: 105 mg/dL
Hgb A1c MFr Bld: 5.3 % (ref 4.8–5.6)

## 2023-10-02 ENCOUNTER — Other Ambulatory Visit: Payer: Self-pay

## 2023-11-08 ENCOUNTER — Telehealth: Payer: Medicaid Other | Admitting: *Deleted

## 2023-11-08 NOTE — Telephone Encounter (Signed)
Dr. Baxter Flattery team follow up required: Patient need assistance with transportation to and from colonoscopy. Patient also needs assistance with navigating Medicaid transportation. Please contact patient at [phone] by 11/19/23. You may also contact Elmer Picker BSN, RN,CCM, Health Equity Program Manager (HEPM) at [phone] if additional information needed.   Chart reviewed and [employer] verification completed.    Telephone call to patient, verify patient's name, date of birth, and address. Patient in agreement to outreach and colon cancer screening (CRC) follow up. Discussed patient's colon cancer screening history per chart review.  Patient states he does not remember completing a colonoscopy or stool specimen in the past. Patient states he is very appreciative of follow up call from HEPM.  Patient in agreement with follow up.   Sheyna Pettibone H. Docia Furl, Charity fundraiser, [facility] [phone]

## 2023-11-11 ENCOUNTER — Telehealth: Payer: Self-pay | Admitting: Pediatrics

## 2023-11-11 NOTE — Telephone Encounter (Signed)
Spoke with patient and Elmer Picker, RN, on conference call, in regards to patients procedure with Dr. Doy Hutching on 2/28. Patient has difficulty with transportation and would like prep medication mailed to him if possible.  Discussed patient can discuss possible options with RN during Pre Op appointment. Notating in chart, so Pre-visit nurse can be aware.

## 2023-11-14 NOTE — Telephone Encounter (Signed)
I called patient to discuss transportation resources and had to leave a message requesting a call back.

## 2023-11-21 NOTE — Telephone Encounter (Signed)
 I spoke to the patient to discuss his upcoming appointments and colonoscopy. I explained that he has a pre-colonoscopy appointment with GI on 12/19/2023.  That will be a telephone call.    He then has an appointment with Dr Delbert at [facility] on 12/25/2023 and and appointment for the colonoscopy on 01/17/2024 and he will need transportation to those appointments. I explained to him that we need to try to schedule the rides through the insurance company- [employer]: [phone].  I provided him with the phone number for transportation and asked that he try calling to arrange the rides.  I also explained that he will need to have someone stay at the GI office while he has the colonoscopy.  He said he was not sure if someone could stay with him.  His sister was planning to stay with him afterwards.  I told him that he must have someone at the GI  office waiting for him while the procedure is being done. He then told me that his sister was with him and I asked if I could speak with her. He agreed and I then spoke to his sister, Christian Matthews.    I provided her with the information about the appointments as well as the transportation phone number and told her that I would also mail the appointment reminders to him. I asked her if she assists him with scheduling appointments and rides and she said sometimes. I asked if she could try arranging the rides for the appointments and she said she would.  I told her that I would call her as well as her brother next week to see if they have been able to arrange rides. I then asked her if she could stay at the doctors office while he has the colonoscopy and she said she could.  Transportation is the only issue. I reminded her again that I would call them next week to see how the transportation arrangements are going and she was very appreciative of the help.

## 2023-11-27 NOTE — Telephone Encounter (Signed)
 I called the patient to inquire if his sister has contacted the insurance company about scheduling rides to the upcoming appointments.  He said he did not think she has called anyone yet.  I asked him if he received the information about the appointments that I mailed to him and he thinks he received it and his sister was reading it. I told him that I will call his sister to follow up and he said that was fine.   I called his sister, Merlynn,  and she said she has not seen any information about the appointments and I told her to please check with her brother about his mail. She has not contacted the insurance company about the rides and I reminded her to please call for information about scheduling those rides and she said she would.

## 2023-12-18 NOTE — Telephone Encounter (Signed)
I called the patient to review his upcoming appointments. Tomorrow, he has a telephone appointment with GI. I explained to him that they will be calling him, he does not have to go to their office. He said he is aware and will make sure to answer the phone around 3:30 pm tomorrow, 12/19/2023.  He repeated multiple times that he will expect the call from [facility] around 3:30 pm.   We then spoke about the appointment with Dr Alvis Lemmings at [facility] on 12/25/2023 at 2:10 pm.  I asked if his sister contacted the insurance company about a ride to that appointment and he said he thinks he has a friend that can drive him.  He wants to save the insurance ride for the colonoscopy.  I told  him to please call this clinic if his friend is not able to bring him to the appointment next week and he said he understood.  He said his sister has not scheduled a ride for the colonoscopy but they will work on it. He said he is doing one appointment at a time.I told him that I can also help him arrange that ride when he is at [facility] next week for his appointment.

## 2023-12-19 ENCOUNTER — Ambulatory Visit: Payer: Medicaid Other | Admitting: *Deleted

## 2023-12-19 VITALS — Ht 65.0 in | Wt 125.0 lb

## 2023-12-19 DIAGNOSIS — Z1211 Encounter for screening for malignant neoplasm of colon: Secondary | ICD-10-CM

## 2023-12-19 MED ORDER — SUFLAVE 178.7 G PO SOLR: 1.0000 | Freq: Once | ORAL | 0 refills | Status: AC

## 2023-12-19 NOTE — Progress Notes (Signed)
Pt's name and DOB verified at the beginning of the pre-visit wit 2 identifiers  Pt denies any difficulty with ambulating,sitting, laying down or rolling side to side  Pt has no issues with ambulation   Pt has no issues moving head neck or swallowing  No egg or soy allergy known to patient   Patient denies ever being intubated  No FH of Malignant Hyperthermia  Pt is not on diet pills or shots  Pt is not on home 02   Pt is not on blood thinners   Pt denies issues with constipation   Pt is not on dialysis  Pt denise any abnormal heart rhythms   Pt denies any upcoming cardiac testing  Pt encouraged to use to use Singlecare or Goodrx to reduce cost   Patient's chart reviewed by Cathlyn Parsons CNRA prior to pre-visit and patient appropriate for the [facility].  Pre-visit completed and red dot placed by patient's name on their procedure day (on provider's schedule).  .  Visit by phone  Pt states weight is 125 lb  Instructed pt why it is important to and  to call if they have any changes in health or new medications. Directed them to the # given and on instructions.     Instructions reviewed. Pt given both [facility] main # and MD on call # prior to instructions.  Pt states understanding. Instructed to review again prior to procedure. Pt states they will.   Informed pt that they will receive a call and a text from [facility] regarding there prep med. Instructed not to smoke any Marijuana day before or day of procedure

## 2023-12-20 ENCOUNTER — Other Ambulatory Visit: Payer: Self-pay | Admitting: *Deleted

## 2023-12-20 DIAGNOSIS — Z1211 Encounter for screening for malignant neoplasm of colon: Secondary | ICD-10-CM

## 2023-12-20 MED ORDER — SUFLAVE 178.7 G PO SOLR: 1.0000 | Freq: Once | ORAL | 0 refills | Status: AC

## 2023-12-23 ENCOUNTER — Other Ambulatory Visit: Payer: Self-pay | Admitting: *Deleted

## 2023-12-23 DIAGNOSIS — Z1211 Encounter for screening for malignant neoplasm of colon: Secondary | ICD-10-CM

## 2023-12-23 MED ORDER — SUFLAVE 178.7 G PO SOLR: 1.0000 | Freq: Once | ORAL | 0 refills | Status: AC

## 2023-12-24 NOTE — Telephone Encounter (Signed)
 I called the patient to remind him of his appointment tomorrow at [facility] and inquire about the status of the ride to the clinic.  He said he is still working on getting a ride.  I explained to him that if he  is not able to secure a ride, to please call us  at [facility] and let us  know so we can arrange a cab ride for him.  I told him that he can call us  later today or even tomorrow morning and we will still be able to get the cab ride scheduled for him.  He said he understood and would call [facility] if a ride is needed.

## 2023-12-25 ENCOUNTER — Other Ambulatory Visit: Payer: Self-pay

## 2023-12-25 ENCOUNTER — Encounter: Payer: Self-pay | Admitting: Family Medicine

## 2023-12-25 ENCOUNTER — Ambulatory Visit: Payer: 59 | Attending: Family Medicine | Admitting: Family Medicine

## 2023-12-25 ENCOUNTER — Telehealth: Payer: Self-pay | Admitting: Family Medicine

## 2023-12-25 VITALS — BP 185/78 | HR 71 | Ht 65.0 in | Wt 123.4 lb

## 2023-12-25 DIAGNOSIS — I10 Essential (primary) hypertension: Secondary | ICD-10-CM

## 2023-12-25 DIAGNOSIS — I700 Atherosclerosis of aorta: Secondary | ICD-10-CM | POA: Diagnosis not present

## 2023-12-25 DIAGNOSIS — F17210 Nicotine dependence, cigarettes, uncomplicated: Secondary | ICD-10-CM | POA: Diagnosis not present

## 2023-12-25 NOTE — Progress Notes (Signed)
 Subjective:  Patient ID: Christian Matthews, male    DOB: [birth year]  Age: 63 y.o. MRN: [id number]  CC: Medical Management of Chronic Issues   HPI Christian Matthews is a 63 y.o. year old male with a history of hypertension, vertigo, tobacco abuse (1 ppd since the age of 79 but recently decreased to 3 Cigarettes/day) who presents today for chronic disease management.    Interval History: Discussed the use of AI scribe software for clinical note transcription with the patient, who gave verbal consent to proceed.  He presents with elevated blood pressure. He reports taking his medication this morning and an additional dose before his appointment. Despite this, his blood pressure is significantly higher than his last visit in November. He is unsure of the names and exact number of his medications, believing he takes three daily, two for hypertension and one for cholesterol. However, he is prescribed four medications, three for hypertension (amlodipine , carvedilol  and losartan /HCTZ) and one for cholesterol (atorvastatin ). He also has a history of smoking, previously consuming a pack a day since the age of 66, but has recently reduced to two or three cigarettes a day.        Past Medical History:  Diagnosis Date   Arthritis    Cataract    Essential hypertension 03/30/2020    No past surgical history on file.  Family History  Problem Relation Age of Onset   Colon polyps Neg Hx    Colon cancer Neg Hx    Esophageal cancer Neg Hx    Rectal cancer Neg Hx    Stomach cancer Neg Hx     Social History   Socioeconomic History   Marital status: Single    Spouse name: Not on file   Number of children: Not on file   Years of education: Not on file   Highest education level: Not on file  Occupational History   Not on file  Tobacco Use   Smoking status: Some Days    Types: Cigarettes   Smokeless tobacco: Never  Substance and Sexual Activity   Alcohol use: Yes   Drug use: Yes    Types: Marijuana    Sexual activity: Not on file  Other Topics Concern   Not on file  Social History Narrative   Not on file   Social Drivers of Health   Financial Resource Strain: Not on file  Food Insecurity: Not on file  Transportation Needs: Unmet Transportation Needs (11/11/2023)   PRAPARE - Administrator, Civil Service (Medical): Yes    Lack of Transportation (Non-Medical): Yes  Physical Activity: Not on file  Stress: Not on file  Social Connections: Not on file    No Known Allergies  Outpatient Medications Prior to Visit  Medication Sig Dispense Refill   amLODipine  (NORVASC ) 10 MG tablet Take 1 tablet (10 mg total) by mouth daily. 90 tablet 1   carvedilol  (COREG ) 12.5 MG tablet Take 1 tablet (12.5 mg total) by mouth 2 (two) times daily with a meal. 180 tablet 1   losartan -hydrochlorothiazide  (HYZAAR ) 100-25 MG tablet Take 1 tablet by mouth daily. 90 tablet 1   atorvastatin  (LIPITOR) 20 MG tablet Take 1 tablet (20 mg total) by mouth daily. (Patient not taking: Reported on 12/25/2023) 90 tablet 1   diclofenac  Sodium (VOLTAREN ) 1 % GEL Apply 4 g topically 4 (four) times daily. (Patient not taking: Reported on 12/25/2023) 100 g 1   meclizine  (ANTIVERT ) 25 MG tablet Take 1 tablet (25 mg total)  by mouth 3 (three) times daily as needed for dizziness. (Patient not taking: Reported on 12/25/2023) 60 tablet 1   Multiple Vitamin (MULTIVITAMIN WITH MINERALS) TABS tablet Take 1 tablet by mouth daily. (Patient not taking: Reported on 04/01/2023) 30 tablet 0   nicotine  (NICODERM CQ ) 7 mg/24hr patch Place 1 patch (7 mg total) onto the skin daily. (Patient not taking: Reported on 12/28/2021) 28 patch 1   thiamine  100 MG tablet Take 1 tablet (100 mg total) by mouth daily. (Patient not taking: Reported on 04/01/2023) 30 tablet 0   No facility-administered medications prior to visit.     ROS Review of Systems  Constitutional:  Negative for activity change and appetite change.  HENT:  Negative for sinus  pressure and sore throat.   Respiratory:  Negative for chest tightness, shortness of breath and wheezing.   Cardiovascular:  Negative for chest pain and palpitations.  Gastrointestinal:  Negative for abdominal distention, abdominal pain and constipation.  Genitourinary: Negative.   Musculoskeletal: Negative.   Psychiatric/Behavioral:  Negative for behavioral problems and dysphoric mood.     Objective:  BP (!) 185/78   Pulse 71   Ht 5' 5 (1.651 m)   Wt 123 lb 6.4 oz (56 kg)   SpO2 100%   BMI 20.53 kg/m      12/25/2023    3:13 PM 12/25/2023    2:42 PM 12/19/2023    3:30 PM  BP/Weight  Systolic BP 185 194   Diastolic BP 78 78   Wt. (Lbs)  123.4 125  BMI  20.53 kg/m2 20.8 kg/m2      Physical Exam Constitutional:      Appearance: He is well-developed.  Cardiovascular:     Rate and Rhythm: Normal rate.     Heart sounds: Normal heart sounds. No murmur heard. Pulmonary:     Effort: Pulmonary effort is normal.     Breath sounds: Normal breath sounds. No wheezing or rales.  Chest:     Chest wall: No tenderness.  Abdominal:     General: Bowel sounds are normal. There is no distension.     Palpations: Abdomen is soft. There is no mass.     Tenderness: There is no abdominal tenderness.  Musculoskeletal:        General: Normal range of motion.     Right lower leg: No edema.     Left lower leg: No edema.  Neurological:     Mental Status: He is alert and oriented to person, place, and time.  Psychiatric:        Mood and Affect: Mood normal.        Latest Ref Rng & Units 09/24/2023    2:04 PM 09/25/2022   11:35 AM 02/05/2022    4:33 PM  CMP  Glucose 70 - 99 mg/dL 87  91  85   BUN 8 - 27 mg/dL 19  7  15    Creatinine 0.76 - 1.27 mg/dL 9.02  9.10  9.02   Sodium 134 - 144 mmol/L 140  140  138   Potassium 3.5 - 5.2 mmol/L 4.1  4.3  3.9   Chloride 96 - 106 mmol/L 100  100  99   CO2 20 - 29 mmol/L 26  26  25    Calcium  8.6 - 10.2 mg/dL 89.8  89.9  89.9   Total Protein 6.0 -  8.5 g/dL 7.9  8.3  7.7   Total Bilirubin 0.0 - 1.2 mg/dL 0.4  0.4  0.6  Alkaline Phos 44 - 121 IU/L 109  100  95   AST 0 - 40 IU/L 23  33  31   ALT 0 - 44 IU/L 18  21  29      Lipid Panel     Component Value Date/Time   CHOL 180 09/25/2022 1135   TRIG 67 09/25/2022 1135   HDL 86 09/25/2022 1135   CHOLHDL 2.5 02/07/2021 1109   LDLCALC 81 09/25/2022 1135    CBC    Component Value Date/Time   WBC 3.0 (L) 09/25/2022 1135   WBC 3.5 (L) 01/30/2020 1934   RBC 4.11 (L) 09/25/2022 1135   RBC 4.53 01/30/2020 1934   HGB 12.7 (L) 09/25/2022 1135   HCT 37.9 09/25/2022 1135   PLT 240 09/25/2022 1135   MCV 92 09/25/2022 1135   MCH 30.9 09/25/2022 1135   MCH 31.6 01/30/2020 1934   MCHC 33.5 09/25/2022 1135   MCHC 34.5 01/30/2020 1934   RDW 13.2 09/25/2022 1135   LYMPHSABS 0.9 09/25/2022 1135   EOSABS 0.1 09/25/2022 1135   BASOSABS 0.0 09/25/2022 1135    Lab Results  Component Value Date   HGBA1C 5.3 09/24/2023    Assessment & Plan:      Hypertension Elevated blood pressure readings today (194/78), up from previous visit (148/80). Patient reports taking medication inconsistently. -Ensure patient has all prescribed medications (three for blood pressure, one for cholesterol). -Check patient's understanding of medication regimen and adherence. -Advised to go down to the pharmacy with his AVS and ensure he has all the medications I prescribed -Schedule follow-up visit in one month with all medication bottles for review.  Hyperlipidemia Patient is prescribed atorvastatin  20mg  for cholesterol management. -Ensure patient has medication and is taking as prescribed.  Tobacco Use Patient reports smoking 2-3 cigarettes per day, down from a pack a day in the past. -Encourage continued reduction and cessation of smoking.  Lung Cancer Screening Patient has a history of heavy smoking and had a lung screening in June 2024. -Plan for repeat lung screening in June.  Colon cancer  screening Patient has a colonoscopy scheduled at the end of the month. -Ensure patient understands preparation and procedure.  General Health Maintenance -Ensure patient picks up medication refills from the pharmacy. -Review medication list with patient to ensure understanding and adherence.          No orders of the defined types were placed in this encounter.   Follow-up: Return in about 1 month (around 01/22/2024) for Blood Pressure follow-up with PCP.       Corrina Sabin, MD, FAAFP. [facility] [place], [place] [phone]   12/25/2023, 5:17 PM

## 2023-12-25 NOTE — Patient Instructions (Signed)
 VISIT SUMMARY:  Mr. Cleotilde, during today's visit, we discussed your elevated blood pressure, cholesterol management, smoking reduction, and upcoming screenings. We reviewed your medication regimen and emphasized the importance of adherence to your prescribed medications.  YOUR PLAN:  -HYPERTENSION: Hypertension means high blood pressure. Your blood pressure was higher than usual today. We need to ensure you have all your prescribed medications and understand how to take them correctly. Please bring all your medication bottles to your next visit in one month.  -HYPERLIPIDEMIA: Hyperlipidemia means high cholesterol levels. You are prescribed atorvastatin  20mg  to manage your cholesterol. Make sure you have this medication and take it as prescribed.  -TOBACCO USE: You have reduced your smoking significantly, which is excellent progress. Continue to work towards quitting completely.  -LUNG CANCER SCREENING: Given your history of heavy smoking, regular lung cancer screenings are important. We will plan for your next screening in June.  -COLONOSCOPY: A colonoscopy is a procedure to for colon cancer. You have one scheduled at the end of the month. Ensure you understand the preparation and procedure.  -GENERAL HEALTH MAINTENANCE: Pick up your medication refills from the pharmacy and review your medication list to ensure you understand and adhere to your regimen.  INSTRUCTIONS:  Please schedule a follow-up visit in one month and bring all your medication bottles for review. Continue taking your medications as prescribed and work towards quitting smoking completely. Prepare for your colonoscopy at the end of the month as instructed.

## 2024-01-08 NOTE — Telephone Encounter (Signed)
I spoke to the patient about the plans for his upcoming colonoscopy next week, 2/28.  He said his sister is not able to accompany him to the appointment any longer because she now has a doctor's appointment that day and she is not able to change it.  I reminded him that he needs to have someone accompany him to the procedure and wait at the office while the colonoscopy is being done.  He said he understood and has asked someone else to accompany him and he is waiting for confirmation from that individual.  That individual also would be able to drive him to that appointment. He said he will know more next week and we can touch base then to assess his plans

## 2024-01-15 ENCOUNTER — Telehealth: Payer: Self-pay | Admitting: Pediatrics

## 2024-01-15 NOTE — Telephone Encounter (Signed)
 Good afternoon,  Called pt about upcoming colonoscopy on 01/17/2024 to see if he received his prep instructions and medications, due to returned mail. Pt stated that he never received prep medication.  Pt will call and reschedule previsit appointment and  procedure.

## 2024-01-16 NOTE — Telephone Encounter (Signed)
 I called the patient to confirm his plans for the colonoscopy tomorrow.  He said he never received the medications for the prep and the procedure was cancelled and he needs to call and reschedule.  He said he had a friend who was going to drive him to /from the procedure and wait for him while the colonoscopy was done. He was not sure when to re-schedule the appointment and asked that I call him next week and we can talk about re-scheduling.

## 2024-01-17 ENCOUNTER — Encounter: Payer: Medicaid Other | Admitting: Pediatrics

## 2024-01-27 NOTE — Telephone Encounter (Signed)
 I called the patient about rescheduling his colonoscopy.  He said next month on a Thursday would be good.  I told him I would schedule it and call him back with the information.  I spoke to Jessica/[facility] and scheduled him for 03/05/2024 @ 1000 and also scheduled him for a pre-op phone call from their nurse on 01/30/2024 @ 1400.  I called the patient back and informed him of the information about the appointments.  He said that last time there was a problem with him getting his medications and he wants to make sure they will be mailed to him.  He said he will talk to the nurse about it when she calls him. I told him that I would mail him an appointment reminder for the colonoscopy and I also reminded him that he will need to arrange for someone to accompany him to the procedure as well as provide transportation and he said he understood. He was also very appreciative of the assistance.

## 2024-01-30 ENCOUNTER — Telehealth: Payer: Self-pay | Admitting: *Deleted

## 2024-01-30 ENCOUNTER — Ambulatory Visit

## 2024-01-30 DIAGNOSIS — Z1211 Encounter for screening for malignant neoplasm of colon: Secondary | ICD-10-CM

## 2024-01-30 MED ORDER — SUFLAVE 178.7 G PO SOLR: 1.0000 | Freq: Once | ORAL | 0 refills | Status: AC

## 2024-01-30 MED ORDER — SUFLAVE 178.7 G PO SOLR: 1.0000 | ORAL | 0 refills | Status: DC

## 2024-01-30 MED ORDER — SUFLAVE 178.7 G PO SOLR: 1.0000 | Freq: Once | ORAL | 0 refills | Status: DC

## 2024-01-31 ENCOUNTER — Telehealth: Payer: Self-pay

## 2024-01-31 NOTE — Telephone Encounter (Signed)
 Copied from CRM [id number]. Topic: Clinical - Prescription Issue >> Jan 31, 2024 10:06 AM Gery Pray wrote: Reason for CRM: Patient called in stating that he has not heard back from the provider nor pharmacy regarding a medication that was supposed to have been delivered to his home for his colonoscopy surgery.

## 2024-01-31 NOTE — Telephone Encounter (Signed)
 Call placed to patient unable to reach message left on VM.  Call to advise he will need to call GI for assistance regarding  the medication that  his is having delivered to his home for his colonoscopy surgery.

## 2024-02-05 ENCOUNTER — Ambulatory Visit: Payer: Medicaid Other | Attending: Family Medicine | Admitting: Family Medicine

## 2024-02-05 ENCOUNTER — Other Ambulatory Visit: Payer: Self-pay

## 2024-02-05 ENCOUNTER — Encounter: Payer: Self-pay | Admitting: Family Medicine

## 2024-02-05 VITALS — BP 137/77 | HR 73 | Ht 65.0 in | Wt 120.8 lb

## 2024-02-05 DIAGNOSIS — I700 Atherosclerosis of aorta: Secondary | ICD-10-CM

## 2024-02-05 DIAGNOSIS — I10 Essential (primary) hypertension: Secondary | ICD-10-CM | POA: Diagnosis not present

## 2024-02-05 MED ORDER — ATORVASTATIN CALCIUM 20 MG PO TABS
20.0000 mg | ORAL_TABLET | Freq: Every day | ORAL | 1 refills | Status: DC
  Filled 2024-02-05: qty 30, 30d supply, fill #0

## 2024-02-05 MED ORDER — CARVEDILOL 12.5 MG PO TABS
12.5000 mg | ORAL_TABLET | Freq: Two times a day (BID) | ORAL | 1 refills | Status: DC
  Filled 2024-02-05: qty 60, 30d supply, fill #0
  Filled 2024-06-17: qty 180, 90d supply, fill #0
  Filled 2024-09-29: qty 180, 90d supply, fill #1

## 2024-02-05 NOTE — Progress Notes (Signed)
 Subjective:  Patient ID: Christian Matthews, male    DOB: May 03, 1961  Age: 63 y.o. MRN: [id number]  CC: Hypertension     Discussed the use of AI scribe software for clinical note transcription with the patient, who gave verbal consent to proceed.  History of Present Illness Christian Matthews is a 62 year old male with hypertension, vertigo, tobacco abuse (1 ppd since the age of 55 but recently decreased to 3 Cigarettes/day) who presents for blood pressure management.  His blood pressure is currently controlled and improved compared to previous levels. He states he adheres to his medication regimen and his medication list reveals he should be on carvedilol, amlodipine, atorvastatin, and losartan/hydrochlorothiazide. He brought carvedilol and atorvastatin to the visit, with two Meclizine bottles and he states that is all he has been taking. At previous visits his blood pressures have been elevated.    Past Medical History:  Diagnosis Date   Arthritis    Cataract    Essential hypertension 03/30/2020    No past surgical history on file.  Family History  Problem Relation Age of Onset   Colon polyps Neg Hx    Colon cancer Neg Hx    Esophageal cancer Neg Hx    Rectal cancer Neg Hx    Stomach cancer Neg Hx     Social History   Socioeconomic History   Marital status: Single    Spouse name: Not on file   Number of children: Not on file   Years of education: Not on file   Highest education level: Not on file  Occupational History   Not on file  Tobacco Use   Smoking status: Some Days    Types: Cigarettes   Smokeless tobacco: Never  Substance and Sexual Activity   Alcohol use: Yes   Drug use: Yes    Types: Marijuana   Sexual activity: Not on file  Other Topics Concern   Not on file  Social History Narrative   Not on file   Social Drivers of Health   Financial Resource Strain: Not on file  Food Insecurity: Not on file  Transportation Needs: Unmet Transportation Needs  (11/11/2023)   PRAPARE - Administrator, Civil Service (Medical): Yes    Lack of Transportation (Non-Medical): Yes  Physical Activity: Not on file  Stress: Not on file  Social Connections: Not on file    No Known Allergies  Outpatient Medications Prior to Visit  Medication Sig Dispense Refill   amLODipine (NORVASC) 10 MG tablet Take 1 tablet (10 mg total) by mouth daily. 90 tablet 1   losartan-hydrochlorothiazide (HYZAAR) 100-25 MG tablet Take 1 tablet by mouth daily. 90 tablet 1   atorvastatin (LIPITOR) 20 MG tablet Take 1 tablet (20 mg total) by mouth daily. 90 tablet 1   carvedilol (COREG) 12.5 MG tablet Take 1 tablet (12.5 mg total) by mouth 2 (two) times daily with a meal. 180 tablet 1   No facility-administered medications prior to visit.     ROS Review of Systems  Constitutional:  Negative for activity change and appetite change.  HENT:  Negative for sinus pressure and sore throat.   Respiratory:  Negative for chest tightness, shortness of breath and wheezing.   Cardiovascular:  Negative for chest pain and palpitations.  Gastrointestinal:  Negative for abdominal distention, abdominal pain and constipation.  Genitourinary: Negative.   Musculoskeletal: Negative.   Psychiatric/Behavioral:  Negative for behavioral problems and dysphoric mood.     Objective:  BP 137/77  Pulse 73   Ht 5\' 5"  (1.651 m)   Wt 120 lb 12.8 oz (54.8 kg)   SpO2 100%   BMI 20.10 kg/m      02/05/2024    2:45 PM 12/25/2023    3:13 PM 12/25/2023    2:42 PM  BP/Weight  Systolic BP 137 185 194  Diastolic BP 77 78 78  Wt. (Lbs) 120.8  123.4  BMI 20.1 kg/m2  20.53 kg/m2      Physical Exam Constitutional:      Appearance: He is well-developed.  Cardiovascular:     Rate and Rhythm: Normal rate.     Heart sounds: Normal heart sounds. No murmur heard. Pulmonary:     Effort: Pulmonary effort is normal.     Breath sounds: Normal breath sounds. No wheezing or rales.  Chest:      Chest wall: No tenderness.  Abdominal:     General: Bowel sounds are normal. There is no distension.     Palpations: Abdomen is soft. There is no mass.     Tenderness: There is no abdominal tenderness.  Musculoskeletal:        General: Normal range of motion.     Right lower leg: No edema.     Left lower leg: No edema.  Neurological:     Mental Status: He is alert and oriented to person, place, and time.  Psychiatric:        Mood and Affect: Mood normal.        Latest Ref Rng & Units 09/24/2023    2:04 PM 09/25/2022   11:35 AM 02/05/2022    4:33 PM  CMP  Glucose 70 - 99 mg/dL 87  91  85   BUN 8 - 27 mg/dL 19  7  15    Creatinine 0.76 - 1.27 mg/dL 1.47  8.29  5.62   Sodium 134 - 144 mmol/L 140  140  138   Potassium 3.5 - 5.2 mmol/L 4.1  4.3  3.9   Chloride 96 - 106 mmol/L 100  100  99   CO2 20 - 29 mmol/L 26  26  25    Calcium 8.6 - 10.2 mg/dL 13.0  86.5  78.4   Total Protein 6.0 - 8.5 g/dL 7.9  8.3  7.7   Total Bilirubin 0.0 - 1.2 mg/dL 0.4  0.4  0.6   Alkaline Phos 44 - 121 IU/L 109  100  95   AST 0 - 40 IU/L 23  33  31   ALT 0 - 44 IU/L 18  21  29      Lipid Panel     Component Value Date/Time   CHOL 180 09/25/2022 1135   TRIG 67 09/25/2022 1135   HDL 86 09/25/2022 1135   CHOLHDL 2.5 02/07/2021 1109   LDLCALC 81 09/25/2022 1135    CBC    Component Value Date/Time   WBC 3.0 (L) 09/25/2022 1135   WBC 3.5 (L) 01/30/2020 1934   RBC 4.11 (L) 09/25/2022 1135   RBC 4.53 01/30/2020 1934   HGB 12.7 (L) 09/25/2022 1135   HCT 37.9 09/25/2022 1135   PLT 240 09/25/2022 1135   MCV 92 09/25/2022 1135   MCH 30.9 09/25/2022 1135   MCH 31.6 01/30/2020 1934   MCHC 33.5 09/25/2022 1135   MCHC 34.5 01/30/2020 1934   RDW 13.2 09/25/2022 1135   LYMPHSABS 0.9 09/25/2022 1135   EOSABS 0.1 09/25/2022 1135   BASOSABS 0.0 09/25/2022 1135    Lab Results  Component Value Date   HGBA1C 5.3 09/24/2023       Assessment & Plan Hypertension Hypertension better controlled.  Confusion about medication regimen as he does not have Amlodipine or Lisinopril/hydrochlorothiazide with him but just Coreg and Atorvastatin.. Meclizine discontinued. - Continue carvedilol and atorvastatin. - Reassess blood pressure at next visit. - Consider discontinuing amlodipine and losartan-hydrochlorothiazide if blood pressure remains controlled at next visit -Counseled on blood pressure goal of less than 130/80, low-sodium, DASH diet, medication compliance, 150 minutes of moderate intensity exercise per week. Discussed medication compliance, adverse effects.       Meds ordered this encounter  Medications   atorvastatin (LIPITOR) 20 MG tablet    Sig: Take 1 tablet (20 mg total) by mouth daily.    Dispense:  90 tablet    Refill:  1   carvedilol (COREG) 12.5 MG tablet    Sig: Take 1 tablet (12.5 mg total) by mouth 2 (two) times daily with a meal.    Dispense:  180 tablet    Refill:  1    Follow-up: Return in about 1 month (around 03/07/2024) for Blood Pressure follow-up with PCP.       Hoy Register, MD, FAAFP. [facility] [place], [place] [phone]   02/05/2024, 5:10 PM

## 2024-02-05 NOTE — Patient Instructions (Signed)
 VISIT SUMMARY:  Today, we discussed your blood pressure management. Your blood pressure is currently well-controlled, and you are taking your medications as prescribed. We reviewed your medication regimen and made some adjustments to ensure clarity and effectiveness.  YOUR PLAN:  -HYPERTENSION: Hypertension, or high blood pressure, is a condition where the force of the blood against your artery walls is too high. Your blood pressure is currently better controlled. We have discontinued Meclizine to avoid confusion with your medication regimen. Please continue taking carvedilol and atorvastatin as prescribed. We will reassess your blood pressure at your next visit and may consider discontinuing amlodipine and losartan-hydrochlorothiazide if your blood pressure remains controlled.  INSTRUCTIONS:  Please continue taking carvedilol and atorvastatin as prescribed. We will reassess your blood pressure at your next visit and may consider discontinuing amlodipine and losartan-hydrochlorothiazide if your blood pressure remains controlled.

## 2024-02-13 ENCOUNTER — Other Ambulatory Visit: Payer: Self-pay

## 2024-03-02 ENCOUNTER — Telehealth: Payer: Self-pay | Admitting: *Deleted

## 2024-03-02 ENCOUNTER — Telehealth: Payer: Self-pay | Admitting: Pediatrics

## 2024-03-02 NOTE — Telephone Encounter (Signed)
 I spoke to the patient about his upcoming colonoscopy, 03/05/2024.  He said he is working on getting a ride to the appointment and having someone stay at the GI office while he has the procedure. He then said that he never received the prep and he may need to re-schedule.  He said he has called GI multiple times but has not heard back from anyone.  I told him that I would call GI and ask them to call him,   I called  GI and spoke to Kent. I explained my conversation with the patient. She said that the prep was mailed to the patient early in March.  I told her that he has not received it and is not sure if he needs to re-schedule the procedure.  She said she will send a message to the nurse and have her call the patient.

## 2024-03-02 NOTE — Telephone Encounter (Signed)
 Inbound call from patients case manager with [facility] and [facility] calling stating that patient is scheduled for a colonoscopy on 4/17 at 10:00 and never received paperwork with instructions. Requesting we call patient to discuss instructions. Please advise.

## 2024-03-02 NOTE — Telephone Encounter (Signed)
 Returned patient call.  Patient states he has hand written instructions but the real issues has  no transportation.  Patient unfortunately unable to identify a day that he can count on transportation.  Canceled colonoscopy and will reach out to patient's case worker

## 2024-03-02 NOTE — Telephone Encounter (Signed)
 Called [facility].  Spoke with receptionist and advised reason patient cancelled colonoscopy was due to transportation.  Was advised this information would be forwarded to patient's case worker.

## 2024-03-04 NOTE — Progress Notes (Deleted)
 Christian Matthews did not show for his scheduled colonoscopy

## 2024-03-05 ENCOUNTER — Encounter: Admitting: Pediatrics

## 2024-03-31 ENCOUNTER — Ambulatory Visit: Admitting: Family Medicine

## 2024-04-28 ENCOUNTER — Ambulatory Visit: Admitting: Family Medicine

## 2024-06-17 ENCOUNTER — Other Ambulatory Visit: Payer: Self-pay

## 2024-06-18 ENCOUNTER — Other Ambulatory Visit: Payer: Self-pay

## 2024-06-30 ENCOUNTER — Encounter: Payer: Self-pay | Admitting: Family Medicine

## 2024-06-30 ENCOUNTER — Ambulatory Visit: Attending: Family Medicine | Admitting: Family Medicine

## 2024-06-30 ENCOUNTER — Other Ambulatory Visit: Payer: Self-pay

## 2024-06-30 VITALS — BP 153/75 | HR 60 | Ht 65.0 in | Wt 115.4 lb

## 2024-06-30 DIAGNOSIS — Z125 Encounter for screening for malignant neoplasm of prostate: Secondary | ICD-10-CM

## 2024-06-30 DIAGNOSIS — Z1211 Encounter for screening for malignant neoplasm of colon: Secondary | ICD-10-CM

## 2024-06-30 DIAGNOSIS — I10 Essential (primary) hypertension: Secondary | ICD-10-CM | POA: Diagnosis not present

## 2024-06-30 MED ORDER — LOSARTAN POTASSIUM-HCTZ 100-25 MG PO TABS
1.0000 | ORAL_TABLET | Freq: Every day | ORAL | 1 refills | Status: DC
  Filled 2024-06-30 – 2024-09-29 (×2): qty 90, 90d supply, fill #0

## 2024-06-30 NOTE — Progress Notes (Signed)
 Subjective:  Patient ID: Christian Matthews, male    DOB: 1961-04-05  Age: 63 y.o. MRN: [id number]  CC: Hypertension     Discussed the use of AI scribe software for clinical note transcription with the patient, who gave verbal consent to proceed.  History of Present Illness Christian Matthews is a 63 year old male with  hypertension, vertigo, tobacco abuse (1 ppd since the age of 27 but recently decreased to 3 Cigarettes/day)  who presents for blood pressure management.  Blood pressure readings have been consistently elevated, with today's measurement at 147/72 mmHg. A previous reading in March was 137/77 mmHg. Current medications include losartan  and hydrochlorothiazide , carvedilol  6.25 mg twice daily, amlodipine  10 mg once daily, and atorvastatin . He has not been taking losartan  hydrochlorothiazide  recently and is running low on one medication.  He faces difficulty attending a colon cancer screening due to lack of transportation and someone to accompany him. He is open to alternative screening methods that do not require a visit.  He lacks funds to pick up his medication from the pharmacy.    Past Medical History:  Diagnosis Date   Arthritis    Cataract    Essential hypertension 03/30/2020    No past surgical history on file.  Family History  Problem Relation Age of Onset   Colon polyps Neg Hx    Colon cancer Neg Hx    Esophageal cancer Neg Hx    Rectal cancer Neg Hx    Stomach cancer Neg Hx     Social History   Socioeconomic History   Marital status: Single    Spouse name: Not on file   Number of children: Not on file   Years of education: Not on file   Highest education level: Not on file  Occupational History   Not on file  Tobacco Use   Smoking status: Some Days    Types: Cigarettes   Smokeless tobacco: Never  Substance and Sexual Activity   Alcohol use: Yes   Drug use: Yes    Types: Marijuana   Sexual activity: Not on file  Other Topics Concern   Not on file   Social History Narrative   Not on file   Social Drivers of Health   Financial Resource Strain: Not on file  Food Insecurity: Not on file  Transportation Needs: Unmet Transportation Needs (11/11/2023)   PRAPARE - Administrator, Civil Service (Medical): Yes    Lack of Transportation (Non-Medical): Yes  Physical Activity: Not on file  Stress: Not on file  Social Connections: Not on file    No Known Allergies  Outpatient Medications Prior to Visit  Medication Sig Dispense Refill   amLODipine  (NORVASC ) 10 MG tablet Take 1 tablet (10 mg total) by mouth daily. 90 tablet 1   atorvastatin  (LIPITOR) 20 MG tablet Take 1 tablet (20 mg total) by mouth daily. 90 tablet 1   carvedilol  (COREG ) 12.5 MG tablet Take 1 tablet (12.5 mg total) by mouth 2 (two) times daily with a meal. 180 tablet 1   losartan -hydrochlorothiazide  (HYZAAR ) 100-25 MG tablet Take 1 tablet by mouth daily. (Patient not taking: Reported on 06/30/2024) 90 tablet 1   No facility-administered medications prior to visit.     ROS Review of Systems  Constitutional:  Negative for activity change and appetite change.  HENT:  Negative for sinus pressure and sore throat.   Respiratory:  Negative for chest tightness, shortness of breath and wheezing.   Cardiovascular:  Negative for  chest pain and palpitations.  Gastrointestinal:  Negative for abdominal distention, abdominal pain and constipation.  Genitourinary: Negative.   Musculoskeletal: Negative.   Psychiatric/Behavioral:  Negative for behavioral problems and dysphoric mood.     Objective:  BP (!) 153/75   Pulse 60   Ht 5' 5 (1.651 m)   Wt 115 lb 6.4 oz (52.3 kg)   SpO2 100%   BMI 19.20 kg/m      06/30/2024    4:34 PM 06/30/2024    3:48 PM 02/05/2024    2:45 PM  BP/Weight  Systolic BP 153 147 137  Diastolic BP 75 72 77  Wt. (Lbs)  115.4 120.8  BMI  19.2 kg/m2 20.1 kg/m2      Physical Exam Constitutional:      Appearance: He is well-developed.   Cardiovascular:     Rate and Rhythm: Normal rate.     Heart sounds: Normal heart sounds. No murmur heard. Pulmonary:     Effort: Pulmonary effort is normal.     Breath sounds: Normal breath sounds. No wheezing or rales.  Chest:     Chest wall: No tenderness.  Abdominal:     General: Bowel sounds are normal. There is no distension.     Palpations: Abdomen is soft. There is no mass.     Tenderness: There is no abdominal tenderness.  Musculoskeletal:        General: Normal range of motion.     Right lower leg: No edema.     Left lower leg: No edema.  Neurological:     Mental Status: He is alert and oriented to person, place, and time.  Psychiatric:        Mood and Affect: Mood normal.        Latest Ref Rng & Units 06/30/2024    4:44 PM 09/24/2023    2:04 PM 09/25/2022   11:35 AM  CMP  Glucose 70 - 99 mg/dL 898  87  91   BUN 8 - 27 mg/dL 16  19  7    Creatinine 0.76 - 1.27 mg/dL 8.98  9.02  9.10   Sodium 134 - 144 mmol/L 142  140  140   Potassium 3.5 - 5.2 mmol/L 4.1  4.1  4.3   Chloride 96 - 106 mmol/L 100  100  100   CO2 20 - 29 mmol/L 20  26  26    Calcium  8.6 - 10.2 mg/dL 89.3  89.8  89.9   Total Protein 6.0 - 8.5 g/dL  7.9  8.3   Total Bilirubin 0.0 - 1.2 mg/dL  0.4  0.4   Alkaline Phos 44 - 121 IU/L  109  100   AST 0 - 40 IU/L  23  33   ALT 0 - 44 IU/L  18  21     Lipid Panel     Component Value Date/Time   CHOL 180 09/25/2022 1135   TRIG 67 09/25/2022 1135   HDL 86 09/25/2022 1135   CHOLHDL 2.5 02/07/2021 1109   LDLCALC 81 09/25/2022 1135    CBC    Component Value Date/Time   WBC 3.0 (L) 09/25/2022 1135   WBC 3.5 (L) 01/30/2020 1934   RBC 4.11 (L) 09/25/2022 1135   RBC 4.53 01/30/2020 1934   HGB 12.7 (L) 09/25/2022 1135   HCT 37.9 09/25/2022 1135   PLT 240 09/25/2022 1135   MCV 92 09/25/2022 1135   MCH 30.9 09/25/2022 1135   MCH 31.6 01/30/2020 1934   MCHC  33.5 09/25/2022 1135   MCHC 34.5 01/30/2020 1934   RDW 13.2 09/25/2022 1135   LYMPHSABS 0.9  09/25/2022 1135   EOSABS 0.1 09/25/2022 1135   BASOSABS 0.0 09/25/2022 1135    Lab Results  Component Value Date   HGBA1C 5.3 09/24/2023        Assessment & Plan Essential hypertension Blood pressure increased to 147/72 mmHg. Non-adherence to medication noted. - Refilled losartan -hydrochlorothiazide  prescription. - Ordered blood work for potassium levels and kidney function. - Repeat blood pressure measurement. -He will need to bring in all his medications at next visit to ensure medications aherence -Counseled on blood pressure goal of less than 130/80, low-sodium, DASH diet, medication compliance, 150 minutes of moderate intensity exercise per week. Discussed medication compliance, adverse effects.   Hyperlipidemia Controlled Continue Atorvastatin  20 mg daily.  General Health Maintenance Colon and prostate cancer screenings needed. Chose stool test for colon cancer due to transportation issues. - Ordered stool test for colon cancer screening. - Ordered blood test for prostate cancer screening.     Meds ordered this encounter  Medications   losartan -hydrochlorothiazide  (HYZAAR ) 100-25 MG tablet    Sig: Take 1 tablet by mouth daily.    Dispense:  90 tablet    Refill:  1    Follow-up: Return in about 3 months (around 09/30/2024) for Chronic medical conditions.       Corrina Sabin, MD, FAAFP. [facility] [place], [place] [phone]   07/01/2024, 6:44 PM

## 2024-06-30 NOTE — Patient Instructions (Signed)
 Managing Your Hypertension Hypertension, also called high blood pressure, is when the force of the blood pressing against the walls of the arteries is too strong. Arteries are blood vessels that carry blood from your heart throughout your body. Hypertension forces the heart to work harder to pump blood and may cause the arteries to become narrow or stiff. Understanding blood pressure readings A blood pressure reading includes a higher number over a lower number: The first, or top, number is called the systolic pressure. It is a measure of the pressure in your arteries as your heart beats. The second, or bottom number, is called the diastolic pressure. It is a measure of the pressure in your arteries as the heart relaxes. For most people, a normal blood pressure is below 120/80. Your personal target blood pressure may vary depending on your medical conditions, your age, and other factors. Blood pressure is classified into four stages. Based on your blood pressure reading, your health care provider may use the following stages to determine what type of treatment you need, if any. Systolic pressure and diastolic pressure are measured in a unit called millimeters of mercury (mmHg). Normal Systolic pressure: below 120. Diastolic pressure: below 80. Elevated Systolic pressure: 120-129. Diastolic pressure: below 80. Hypertension stage 1 Systolic pressure: 130-139. Diastolic pressure: 80-89. Hypertension stage 2 Systolic pressure: 140 or above. Diastolic pressure: 90 or above. How can this condition affect me? Managing your hypertension is very important. Over time, hypertension can damage the arteries and decrease blood flow to parts of the body, including the brain, heart, and kidneys. Having untreated or uncontrolled hypertension can lead to: A heart attack. A stroke. A weakened blood vessel (aneurysm). Heart failure. Kidney damage. Eye damage. Memory and concentration problems. Vascular  dementia. What actions can I take to manage this condition? Hypertension can be managed by making lifestyle changes and possibly by taking medicines. Your health care provider will help you make a plan to bring your blood pressure within a normal range. You may be referred for counseling on a healthy diet and physical activity. Nutrition  Eat a diet that is high in fiber and potassium, and low in salt (sodium), added sugar, and fat. An example eating plan is called the DASH diet. DASH stands for Dietary Approaches to Stop Hypertension. To eat this way: Eat plenty of fresh fruits and vegetables. Try to fill one-half of your plate at each meal with fruits and vegetables. Eat whole grains, such as whole-wheat pasta, brown rice, or whole-grain bread. Fill about one-fourth of your plate with whole grains. Eat low-fat dairy products. Avoid fatty cuts of meat, processed or cured meats, and poultry with skin. Fill about one-fourth of your plate with lean proteins such as fish, chicken without skin, beans, eggs, and tofu. Avoid pre-made and processed foods. These tend to be higher in sodium, added sugar, and fat. Reduce your daily sodium intake. Many people with hypertension should eat less than 1,500 mg of sodium a day. Lifestyle  Work with your health care provider to maintain a healthy body weight or to lose weight. Ask what an ideal weight is for you. Get at least 30 minutes of exercise that causes your heart to beat faster (aerobic exercise) most days of the week. Activities may include walking, swimming, or biking. Include exercise to strengthen your muscles (resistance exercise), such as weight lifting, as part of your weekly exercise routine. Try to do these types of exercises for 30 minutes at least 3 days a week. Do  not use any products that contain nicotine  or tobacco. These products include cigarettes, chewing tobacco, and vaping devices, such as e-cigarettes. If you need help quitting, ask your  health care provider. Control any long-term (chronic) conditions you have, such as high cholesterol or diabetes. Identify your sources of stress and find ways to manage stress. This may include meditation, deep breathing, or making time for fun activities. Alcohol use Do not drink alcohol if: Your health care provider tells you not to drink. You are pregnant, may be pregnant, or are planning to become pregnant. If you drink alcohol: Limit how much you have to: 0-1 drink a day for women. 0-2 drinks a day for men. Know how much alcohol is in your drink. In the [place] one drink equals one 12 oz bottle of beer (355 mL), one 5 oz glass of wine (148 mL), or one 1 oz glass of hard liquor (44 mL). Medicines Your health care provider may prescribe medicine if lifestyle changes are not enough to get your blood pressure under control and if: Your systolic blood pressure is 130 or higher. Your diastolic blood pressure is 80 or higher. Take medicines only as told by your health care provider. Follow the directions carefully. Blood pressure medicines must be taken as told by your health care provider. The medicine does not work as well when you skip doses. Skipping doses also puts you at risk for problems. Monitoring Before you monitor your blood pressure: Do not smoke, drink caffeinated beverages, or exercise within 30 minutes before taking a measurement. Use the bathroom and empty your bladder (urinate). Sit quietly for at least 5 minutes before taking measurements. Monitor your blood pressure at home as told by your health care provider. To do this: Sit with your back straight and supported. Place your feet flat on the floor. Do not cross your legs. Support your arm on a flat surface, such as a table. Make sure your upper arm is at heart level. Each time you measure, take two or three readings one minute apart and record the results. You may also need to have your blood pressure checked regularly by  your health care provider. General information Talk with your health care provider about your diet, exercise habits, and other lifestyle factors that may be contributing to hypertension. Review all the medicines you take with your health care provider because there may be side effects or interactions. Keep all follow-up visits. Your health care provider can help you create and adjust your plan for managing your high blood pressure. Where to find more information National Heart, Lung, and Blood Institute: [employer].is American Heart Association: www.heart.org Contact a health care provider if: You think you are having a reaction to medicines you have taken. You have repeated (recurrent) headaches. You feel dizzy. You have swelling in your ankles. You have trouble with your vision. Get help right away if: You develop a severe headache or confusion. You have unusual weakness or numbness, or you feel faint. You have severe pain in your chest or abdomen. You vomit repeatedly. You have trouble breathing. These symptoms may be an emergency. Get help right away. Call 911. Do not wait to see if the symptoms will go away. Do not drive yourself to the hospital. Summary Hypertension is when the force of blood pumping through your arteries is too strong. If this condition is not controlled, it may put you at risk for serious complications. Your personal target blood pressure may vary depending on your medical conditions,  your age, and other factors. For most people, a normal blood pressure is less than 120/80. Hypertension is managed by lifestyle changes, medicines, or both. Lifestyle changes to help manage hypertension include losing weight, eating a healthy, low-sodium diet, exercising more, stopping smoking, and limiting alcohol. This information is not intended to replace advice given to you by your health care provider. Make sure you discuss any questions you have with your health care  provider. Document Revised: 07/20/2021 Document Reviewed: 07/20/2021 Elsevier Patient Education  2024 [employer].

## 2024-07-01 ENCOUNTER — Encounter: Payer: Self-pay | Admitting: Family Medicine

## 2024-07-01 ENCOUNTER — Ambulatory Visit: Payer: Self-pay | Admitting: Family Medicine

## 2024-07-01 LAB — BASIC METABOLIC PANEL WITH GFR
BUN/Creatinine Ratio: 16 (ref 10–24)
BUN: 16 mg/dL (ref 8–27)
CO2: 20 mmol/L (ref 20–29)
Calcium: 10.6 mg/dL — ABNORMAL HIGH (ref 8.6–10.2)
Chloride: 100 mmol/L (ref 96–106)
Creatinine, Ser: 1.01 mg/dL (ref 0.76–1.27)
Glucose: 101 mg/dL — ABNORMAL HIGH (ref 70–99)
Potassium: 4.1 mmol/L (ref 3.5–5.2)
Sodium: 142 mmol/L (ref 134–144)
eGFR: 83 mL/min/1.73 (ref >59)

## 2024-07-01 LAB — PSA, TOTAL AND FREE
PSA, Free Pct: 21.3 %
PSA, Free: 0.49 ng/mL
Prostate Specific Ag, Serum: 2.3 ng/mL (ref 0.0–4.0)

## 2024-07-09 ENCOUNTER — Other Ambulatory Visit: Payer: Self-pay

## 2024-09-22 ENCOUNTER — Telehealth: Payer: Self-pay | Admitting: Family Medicine

## 2024-09-22 NOTE — Telephone Encounter (Signed)
 Hi Jane, would be able to assist this patient.

## 2024-09-22 NOTE — Telephone Encounter (Signed)
 Copied from CRM #[id number]. Topic: Clinical - Medication Question >> Sep 22, 2024  3:21 PM Pinkey ORN wrote:  Reason for CRM: Medications Coverage  >> Sep 22, 2024  3:23 PM Pinkey ORN wrote: Patient states that he is currently homeless and doesn't have the money to pay for his medications. Patient states he has his insurance card and wants to know if there's any other way he could receive his medications. Please follow up with patient.

## 2024-09-23 NOTE — Telephone Encounter (Signed)
 I returned the call to the patient: [phone]  and had to leave a message requesting a call back.   I want to let him know that if he is unable to afford his Medicaid co-pays for his meds, he just needs to let the pharmacy know that he is not able to afford the co-pays and they should give him the medications. I am not sure if some medications are excluded; but he will need to speak to his pharmacy about that.

## 2024-09-24 NOTE — Telephone Encounter (Signed)
 I spoke to the patient and explained to him that when he goes to pick up his medications and he is not able to afford the co-pays, he needs to let the pharmacy know that he is not able to afford them  and they should waive the co-pays and give him his medications.   He was very appreciative of the information and did not have any other questions/concerns.

## 2024-09-29 ENCOUNTER — Other Ambulatory Visit: Payer: Self-pay

## 2024-09-30 ENCOUNTER — Other Ambulatory Visit: Payer: Self-pay

## 2024-09-30 ENCOUNTER — Encounter: Payer: Self-pay | Admitting: Family Medicine

## 2024-09-30 ENCOUNTER — Ambulatory Visit: Attending: Family Medicine | Admitting: Family Medicine

## 2024-09-30 VITALS — BP 150/73 | HR 75 | Resp 20 | Ht 65.0 in | Wt 118.0 lb

## 2024-09-30 DIAGNOSIS — I10 Essential (primary) hypertension: Secondary | ICD-10-CM

## 2024-09-30 DIAGNOSIS — I700 Atherosclerosis of aorta: Secondary | ICD-10-CM | POA: Diagnosis not present

## 2024-09-30 DIAGNOSIS — Z1211 Encounter for screening for malignant neoplasm of colon: Secondary | ICD-10-CM | POA: Diagnosis not present

## 2024-09-30 DIAGNOSIS — F17210 Nicotine dependence, cigarettes, uncomplicated: Secondary | ICD-10-CM

## 2024-09-30 MED ORDER — ATORVASTATIN CALCIUM 20 MG PO TABS
20.0000 mg | ORAL_TABLET | Freq: Every day | ORAL | 1 refills | Status: AC
Start: 2024-09-30 — End: ?
  Filled 2024-09-30: qty 90, 90d supply, fill #0

## 2024-09-30 MED ORDER — LOSARTAN POTASSIUM-HCTZ 100-25 MG PO TABS: 1.0000 | ORAL_TABLET | Freq: Every day | ORAL | 1 refills | Status: AC

## 2024-09-30 MED ORDER — AMLODIPINE BESYLATE 10 MG PO TABS
10.0000 mg | ORAL_TABLET | Freq: Every day | ORAL | 1 refills | Status: AC
  Filled 2024-09-30: qty 90, 90d supply, fill #0

## 2024-09-30 MED ORDER — CARVEDILOL 12.5 MG PO TABS: 12.5000 mg | ORAL_TABLET | Freq: Two times a day (BID) | ORAL | 1 refills | Status: AC

## 2024-09-30 NOTE — Progress Notes (Signed)
 Subjective:  Patient ID: Christian Matthews, male    DOB: Feb 09, 1961  Age: 63 y.o. MRN: [id number]  CC: Hypertension     Discussed the use of AI scribe software for clinical note transcription with the patient, who gave verbal consent to proceed.  History of Present Illness Christian Matthews is a 63 year old male with hypertension, vertigo, tobacco abuse (1 ppd since the age of 64 but recently decreased to 3 Cigarettes/day) who presents for a follow-up on blood pressure management.  He takes losartan , amlodipine , and carvedilol  for blood pressure management but ran out of amlodipine , leading to elevated readings. His recent blood pressure was 150/74 mmHg. He regularly takes atorvastatin  for aortic atherosclerosis  He has a history of smoking but has cut back on his cigarettes and smokes infrequently. He was screened for lung cancer last year.  Denies presence of additional symptoms.  Past Medical History:  Diagnosis Date   Arthritis    Cataract    Essential hypertension 03/30/2020    History reviewed. No pertinent surgical history.  Family History  Problem Relation Age of Onset   Colon polyps Neg Hx    Colon cancer Neg Hx    Esophageal cancer Neg Hx    Rectal cancer Neg Hx    Stomach cancer Neg Hx     Social History   Socioeconomic History   Marital status: Single    Spouse name: Not on file   Number of children: Not on file   Years of education: Not on file   Highest education level: Not on file  Occupational History   Not on file  Tobacco Use   Smoking status: Some Days    Types: Cigarettes   Smokeless tobacco: Never  Vaping Use   Vaping status: Never Used  Substance and Sexual Activity   Alcohol use: Yes    Comment: Occ   Drug use: Yes    Types: Marijuana   Sexual activity: Not on file  Other Topics Concern   Not on file  Social History Narrative   Not on file   Social Drivers of Health   Financial Resource Strain: Not on file  Food Insecurity: Not on file   Transportation Needs: Unmet Transportation Needs (11/11/2023)   PRAPARE - Administrator, Civil Service (Medical): Yes    Lack of Transportation (Non-Medical): Yes  Physical Activity: Not on file  Stress: Not on file  Social Connections: Not on file    No Known Allergies  Outpatient Medications Prior to Visit  Medication Sig Dispense Refill   amLODipine  (NORVASC ) 10 MG tablet Take 1 tablet (10 mg total) by mouth daily. 90 tablet 1   carvedilol  (COREG ) 12.5 MG tablet Take 1 tablet (12.5 mg total) by mouth 2 (two) times daily with a meal. 180 tablet 1   losartan -hydrochlorothiazide  (HYZAAR ) 100-25 MG tablet Take 1 tablet by mouth daily. 90 tablet 1   atorvastatin  (LIPITOR) 20 MG tablet Take 1 tablet (20 mg total) by mouth daily. 90 tablet 1   No facility-administered medications prior to visit.     ROS Review of Systems  Constitutional:  Negative for activity change and appetite change.  HENT:  Negative for sinus pressure and sore throat.   Respiratory:  Negative for chest tightness, shortness of breath and wheezing.   Cardiovascular:  Negative for chest pain and palpitations.  Gastrointestinal:  Negative for abdominal distention, abdominal pain and constipation.  Genitourinary: Negative.   Musculoskeletal: Negative.   Psychiatric/Behavioral:  Negative  for behavioral problems and dysphoric mood.     Objective:  BP (!) 150/73 (BP Location: Left Arm, Patient Position: Sitting, Cuff Size: Small)   Pulse 75   Resp 20   Ht 5' 5 (1.651 m)   Wt 118 lb (53.5 kg)   SpO2 98%   BMI 19.64 kg/m      09/30/2024   11:10 AM 09/30/2024   10:40 AM 06/30/2024    4:34 PM  BP/Weight  Systolic BP 150 150 153  Diastolic BP 73 74 75  Wt. (Lbs)  118   BMI  19.64 kg/m2       Physical Exam Constitutional:      Appearance: He is well-developed.  Cardiovascular:     Rate and Rhythm: Normal rate.     Heart sounds: Normal heart sounds. No murmur heard. Pulmonary:      Effort: Pulmonary effort is normal.     Breath sounds: Normal breath sounds. No wheezing or rales.  Chest:     Chest wall: No tenderness.  Abdominal:     General: Bowel sounds are normal. There is no distension.     Palpations: Abdomen is soft. There is no mass.     Tenderness: There is no abdominal tenderness.  Musculoskeletal:        General: Normal range of motion.     Right lower leg: No edema.     Left lower leg: No edema.  Neurological:     Mental Status: He is alert and oriented to person, place, and time.  Psychiatric:        Mood and Affect: Mood normal.        Latest Ref Rng & Units 06/30/2024    4:44 PM 09/24/2023    2:04 PM 09/25/2022   11:35 AM  CMP  Glucose 70 - 99 mg/dL 898  87  91   BUN 8 - 27 mg/dL 16  19  7    Creatinine 0.76 - 1.27 mg/dL 8.98  9.02  9.10   Sodium 134 - 144 mmol/L 142  140  140   Potassium 3.5 - 5.2 mmol/L 4.1  4.1  4.3   Chloride 96 - 106 mmol/L 100  100  100   CO2 20 - 29 mmol/L 20  26  26    Calcium  8.6 - 10.2 mg/dL 89.3  89.8  89.9   Total Protein 6.0 - 8.5 g/dL  7.9  8.3   Total Bilirubin 0.0 - 1.2 mg/dL  0.4  0.4   Alkaline Phos 44 - 121 IU/L  109  100   AST 0 - 40 IU/L  23  33   ALT 0 - 44 IU/L  18  21     Lipid Panel     Component Value Date/Time   CHOL 180 09/25/2022 1135   TRIG 67 09/25/2022 1135   HDL 86 09/25/2022 1135   CHOLHDL 2.5 02/07/2021 1109   LDLCALC 81 09/25/2022 1135    CBC    Component Value Date/Time   WBC 3.0 (L) 09/25/2022 1135   WBC 3.5 (L) 01/30/2020 1934   RBC 4.11 (L) 09/25/2022 1135   RBC 4.53 01/30/2020 1934   HGB 12.7 (L) 09/25/2022 1135   HCT 37.9 09/25/2022 1135   PLT 240 09/25/2022 1135   MCV 92 09/25/2022 1135   MCH 30.9 09/25/2022 1135   MCH 31.6 01/30/2020 1934   MCHC 33.5 09/25/2022 1135   MCHC 34.5 01/30/2020 1934   RDW 13.2 09/25/2022 1135   LYMPHSABS  0.9 09/25/2022 1135   EOSABS 0.1 09/25/2022 1135   BASOSABS 0.0 09/25/2022 1135    Lab Results  Component Value Date    HGBA1C 5.3 09/24/2023       Assessment & Plan Essential hypertension Blood pressure elevated at 150/74 mmHg due to missed amlodipine  doses. - Refilled losartan , amlodipine , and carvedilol  prescriptions. - Instructed to take all medications as prescribed. -Counseled on blood pressure goal of less than 130/80, low-sodium, DASH diet, medication compliance, 150 minutes of moderate intensity exercise per week. Discussed medication compliance, adverse effects.   Atherosclerosis of aorta Continues atorvastatin  for cholesterol management. - Continue atorvastatin  20 mg oral daily. -Low cholesterol diet    Smoking greater then 20 pack yesr history Nicotine  dependence, cigarettes Occasional smoking with reduced frequency. - Ordered CT scan of the lungs for lung cancer screening.  General Health Maintenance Screening for Colon cancer - Due for colonoscopy. - Re-referred for colonoscopy.       Meds ordered this encounter  Medications   amLODipine  (NORVASC ) 10 MG tablet    Sig: Take 1 tablet (10 mg total) by mouth daily.    Dispense:  90 tablet    Refill:  1   carvedilol  (COREG ) 12.5 MG tablet    Sig: Take 1 tablet (12.5 mg total) by mouth 2 (two) times daily with a meal.    Dispense:  180 tablet    Refill:  1   atorvastatin  (LIPITOR) 20 MG tablet    Sig: Take 1 tablet (20 mg total) by mouth daily.    Dispense:  90 tablet    Refill:  1   losartan -hydrochlorothiazide  (HYZAAR ) 100-25 MG tablet    Sig: Take 1 tablet by mouth daily.    Dispense:  90 tablet    Refill:  1    Follow-up: Return in about 3 months (around 12/31/2024) for Chronic medical conditions.       Corrina Sabin, MD, FAAFP. [facility] [place], [place] [phone]   09/30/2024, 11:11 AM

## 2024-09-30 NOTE — Patient Instructions (Signed)
 VISIT SUMMARY:  Today, you came in for a follow-up on your blood pressure management. We discussed your current medications and recent blood pressure readings. We also reviewed your cholesterol management and smoking history.  YOUR PLAN:  -ESSENTIAL HYPERTENSION: Essential hypertension means high blood pressure without a known secondary cause. Your blood pressure was elevated at 150/74 mmHg because you missed some doses of your medication. We have refilled your prescriptions for losartan , amlodipine , and carvedilol . Please take all your medications as prescribed to manage your blood pressure effectively.  -HYPERLIPIDEMIA AND ATHEROSCLEROSIS OF AORTA: Hyperlipidemia means high levels of fats (lipids) in your blood, which can lead to atherosclerosis, a condition where the arteries become narrowed and hardened. You should continue taking atorvastatin  20 mg daily to manage your cholesterol levels.  -NICOTINE  DEPENDENCE, CIGARETTES: Nicotine  dependence means you have a reliance on nicotine , typically from smoking. Although you smoke infrequently now, we have ordered a CT scan of your lungs to screen for lung cancer as a precaution.  -GENERAL HEALTH MAINTENANCE: You are due for a colonoscopy, which is a screening test for colon cancer. We have re-referred you for this procedure.  INSTRUCTIONS:  Please take all your medications as prescribed to manage your blood pressure. Continue taking atorvastatin  20 mg daily for cholesterol management. Schedule your CT scan for lung cancer screening and your colonoscopy as soon as possible. Follow up with us  after completing these tests or sooner if you have any concerns.

## 2024-12-31 ENCOUNTER — Ambulatory Visit: Admitting: Family Medicine
